# Patient Record
Sex: Female | Born: 1980
Health system: Southern US, Community
[De-identification: ages and names within clinical notes are randomized; demographics above are authoritative.]

## PROBLEM LIST (undated history)

## (undated) ENCOUNTER — Inpatient Hospital Stay (HOSPITAL_COMMUNITY): Payer: Self-pay

## (undated) DIAGNOSIS — B019 Varicella without complication: Secondary | ICD-10-CM

## (undated) DIAGNOSIS — R519 Headache, unspecified: Secondary | ICD-10-CM

## (undated) DIAGNOSIS — F909 Attention-deficit hyperactivity disorder, unspecified type: Secondary | ICD-10-CM

## (undated) DIAGNOSIS — N390 Urinary tract infection, site not specified: Secondary | ICD-10-CM

## (undated) DIAGNOSIS — R51 Headache: Secondary | ICD-10-CM

## (undated) DIAGNOSIS — Z789 Other specified health status: Secondary | ICD-10-CM

## (undated) DIAGNOSIS — G43909 Migraine, unspecified, not intractable, without status migrainosus: Secondary | ICD-10-CM

## (undated) DIAGNOSIS — Z973 Presence of spectacles and contact lenses: Secondary | ICD-10-CM

## (undated) DIAGNOSIS — F419 Anxiety disorder, unspecified: Secondary | ICD-10-CM

## (undated) DIAGNOSIS — F329 Major depressive disorder, single episode, unspecified: Secondary | ICD-10-CM

## (undated) DIAGNOSIS — E282 Polycystic ovarian syndrome: Secondary | ICD-10-CM

## (undated) DIAGNOSIS — K219 Gastro-esophageal reflux disease without esophagitis: Secondary | ICD-10-CM

## (undated) DIAGNOSIS — L03119 Cellulitis of unspecified part of limb: Secondary | ICD-10-CM

## (undated) DIAGNOSIS — R600 Localized edema: Secondary | ICD-10-CM

## (undated) DIAGNOSIS — F32A Depression, unspecified: Secondary | ICD-10-CM

## (undated) DIAGNOSIS — E559 Vitamin D deficiency, unspecified: Secondary | ICD-10-CM

## (undated) HISTORY — PX: WISDOM TOOTH EXTRACTION: SHX21

## (undated) HISTORY — DX: Varicella without complication: B01.9

## (undated) HISTORY — DX: Major depressive disorder, single episode, unspecified: F32.9

## (undated) HISTORY — DX: Migraine, unspecified, not intractable, without status migrainosus: G43.909

## (undated) HISTORY — DX: Headache, unspecified: R51.9

## (undated) HISTORY — PX: ESOPHAGOGASTRODUODENOSCOPY: SHX1529

## (undated) HISTORY — DX: Depression, unspecified: F32.A

## (undated) HISTORY — DX: Headache: R51

## (undated) HISTORY — DX: Urinary tract infection, site not specified: N39.0

---

## 1998-12-22 HISTORY — PX: LEEP: SHX91

## 1998-12-28 ENCOUNTER — Emergency Department (HOSPITAL_COMMUNITY): Admission: EM | Admit: 1998-12-28 | Discharge: 1998-12-28 | Payer: Self-pay | Admitting: *Deleted

## 1999-03-09 ENCOUNTER — Inpatient Hospital Stay (HOSPITAL_COMMUNITY): Admission: AD | Admit: 1999-03-09 | Discharge: 1999-03-09 | Payer: Self-pay | Admitting: Obstetrics & Gynecology

## 1999-04-14 ENCOUNTER — Inpatient Hospital Stay (HOSPITAL_COMMUNITY): Admission: AD | Admit: 1999-04-14 | Discharge: 1999-04-14 | Payer: Self-pay | Admitting: Obstetrics & Gynecology

## 1999-05-04 ENCOUNTER — Inpatient Hospital Stay (HOSPITAL_COMMUNITY): Admission: AD | Admit: 1999-05-04 | Discharge: 1999-05-04 | Payer: Self-pay | Admitting: Obstetrics and Gynecology

## 1999-05-13 ENCOUNTER — Inpatient Hospital Stay (HOSPITAL_COMMUNITY): Admission: AD | Admit: 1999-05-13 | Discharge: 1999-05-13 | Payer: Self-pay | Admitting: Obstetrics and Gynecology

## 1999-05-29 ENCOUNTER — Inpatient Hospital Stay (HOSPITAL_COMMUNITY): Admission: AD | Admit: 1999-05-29 | Discharge: 1999-05-31 | Payer: Self-pay | Admitting: Obstetrics and Gynecology

## 1999-08-12 ENCOUNTER — Other Ambulatory Visit: Admission: RE | Admit: 1999-08-12 | Discharge: 1999-08-12 | Payer: Self-pay | Admitting: Obstetrics & Gynecology

## 2000-08-11 ENCOUNTER — Other Ambulatory Visit: Admission: RE | Admit: 2000-08-11 | Discharge: 2000-08-11 | Payer: Self-pay | Admitting: Obstetrics and Gynecology

## 2001-02-01 ENCOUNTER — Encounter: Payer: Self-pay | Admitting: Emergency Medicine

## 2001-02-01 ENCOUNTER — Emergency Department (HOSPITAL_COMMUNITY): Admission: EM | Admit: 2001-02-01 | Discharge: 2001-02-02 | Payer: Self-pay | Admitting: Emergency Medicine

## 2001-03-29 ENCOUNTER — Emergency Department (HOSPITAL_COMMUNITY): Admission: EM | Admit: 2001-03-29 | Discharge: 2001-03-29 | Payer: Self-pay | Admitting: *Deleted

## 2001-06-15 ENCOUNTER — Emergency Department (HOSPITAL_COMMUNITY): Admission: EM | Admit: 2001-06-15 | Discharge: 2001-06-15 | Payer: Self-pay | Admitting: Emergency Medicine

## 2001-09-21 ENCOUNTER — Encounter: Payer: Self-pay | Admitting: Family Medicine

## 2001-09-21 ENCOUNTER — Encounter: Admission: RE | Admit: 2001-09-21 | Discharge: 2001-09-21 | Payer: Self-pay | Admitting: Family Medicine

## 2001-11-22 ENCOUNTER — Emergency Department (HOSPITAL_COMMUNITY): Admission: EM | Admit: 2001-11-22 | Discharge: 2001-11-22 | Payer: Self-pay | Admitting: Emergency Medicine

## 2001-11-22 ENCOUNTER — Encounter: Payer: Self-pay | Admitting: Emergency Medicine

## 2001-11-25 ENCOUNTER — Emergency Department (HOSPITAL_COMMUNITY): Admission: EM | Admit: 2001-11-25 | Discharge: 2001-11-25 | Payer: Self-pay | Admitting: Emergency Medicine

## 2001-12-22 HISTORY — PX: OTHER SURGICAL HISTORY: SHX169

## 2002-05-25 ENCOUNTER — Inpatient Hospital Stay (HOSPITAL_COMMUNITY): Admission: AD | Admit: 2002-05-25 | Discharge: 2002-05-25 | Payer: Self-pay | Admitting: Obstetrics and Gynecology

## 2002-05-27 ENCOUNTER — Encounter: Payer: Self-pay | Admitting: *Deleted

## 2002-05-27 ENCOUNTER — Inpatient Hospital Stay (HOSPITAL_COMMUNITY): Admission: AD | Admit: 2002-05-27 | Discharge: 2002-05-27 | Payer: Self-pay | Admitting: *Deleted

## 2002-12-22 HISTORY — PX: LEEP: SHX91

## 2003-04-06 ENCOUNTER — Inpatient Hospital Stay (HOSPITAL_COMMUNITY): Admission: AD | Admit: 2003-04-06 | Discharge: 2003-04-06 | Payer: Self-pay | Admitting: Family Medicine

## 2003-07-20 ENCOUNTER — Encounter: Payer: Self-pay | Admitting: Obstetrics & Gynecology

## 2003-07-20 ENCOUNTER — Ambulatory Visit (HOSPITAL_COMMUNITY): Admission: RE | Admit: 2003-07-20 | Discharge: 2003-07-20 | Payer: Self-pay | Admitting: Obstetrics & Gynecology

## 2003-08-19 ENCOUNTER — Inpatient Hospital Stay (HOSPITAL_COMMUNITY): Admission: AD | Admit: 2003-08-19 | Discharge: 2003-08-19 | Payer: Self-pay | Admitting: Obstetrics

## 2003-09-28 ENCOUNTER — Encounter: Payer: Self-pay | Admitting: Obstetrics & Gynecology

## 2003-09-28 ENCOUNTER — Ambulatory Visit (HOSPITAL_COMMUNITY): Admission: RE | Admit: 2003-09-28 | Discharge: 2003-09-28 | Payer: Self-pay | Admitting: Obstetrics & Gynecology

## 2004-01-11 ENCOUNTER — Inpatient Hospital Stay (HOSPITAL_COMMUNITY): Admission: AD | Admit: 2004-01-11 | Discharge: 2004-01-11 | Payer: Self-pay | Admitting: Obstetrics & Gynecology

## 2004-01-23 ENCOUNTER — Inpatient Hospital Stay (HOSPITAL_COMMUNITY): Admission: AD | Admit: 2004-01-23 | Discharge: 2004-01-24 | Payer: Self-pay | Admitting: Obstetrics & Gynecology

## 2004-02-26 ENCOUNTER — Inpatient Hospital Stay (HOSPITAL_COMMUNITY): Admission: AD | Admit: 2004-02-26 | Discharge: 2004-02-28 | Payer: Self-pay | Admitting: Obstetrics & Gynecology

## 2004-03-12 ENCOUNTER — Emergency Department (HOSPITAL_COMMUNITY): Admission: EM | Admit: 2004-03-12 | Discharge: 2004-03-12 | Payer: Self-pay | Admitting: Emergency Medicine

## 2004-05-28 ENCOUNTER — Emergency Department (HOSPITAL_COMMUNITY): Admission: EM | Admit: 2004-05-28 | Discharge: 2004-05-29 | Payer: Self-pay | Admitting: Emergency Medicine

## 2004-08-17 ENCOUNTER — Emergency Department (HOSPITAL_COMMUNITY): Admission: EM | Admit: 2004-08-17 | Discharge: 2004-08-17 | Payer: Self-pay | Admitting: Emergency Medicine

## 2004-08-18 ENCOUNTER — Ambulatory Visit (HOSPITAL_COMMUNITY): Admission: RE | Admit: 2004-08-18 | Discharge: 2004-08-18 | Payer: Self-pay | Admitting: Internal Medicine

## 2004-11-24 ENCOUNTER — Emergency Department (HOSPITAL_COMMUNITY): Admission: EM | Admit: 2004-11-24 | Discharge: 2004-11-24 | Payer: Self-pay | Admitting: Emergency Medicine

## 2004-11-29 ENCOUNTER — Emergency Department (HOSPITAL_COMMUNITY): Admission: EM | Admit: 2004-11-29 | Discharge: 2004-11-29 | Payer: Self-pay | Admitting: Emergency Medicine

## 2004-12-01 ENCOUNTER — Emergency Department (HOSPITAL_COMMUNITY): Admission: EM | Admit: 2004-12-01 | Discharge: 2004-12-01 | Payer: Self-pay | Admitting: Emergency Medicine

## 2004-12-22 HISTORY — PX: HAND SURGERY: SHX662

## 2006-05-18 ENCOUNTER — Emergency Department (HOSPITAL_COMMUNITY): Admission: EM | Admit: 2006-05-18 | Discharge: 2006-05-18 | Payer: Self-pay | Admitting: Emergency Medicine

## 2006-05-21 ENCOUNTER — Emergency Department (HOSPITAL_COMMUNITY): Admission: EM | Admit: 2006-05-21 | Discharge: 2006-05-21 | Payer: Self-pay | Admitting: Emergency Medicine

## 2007-12-20 ENCOUNTER — Inpatient Hospital Stay (HOSPITAL_COMMUNITY): Admission: AD | Admit: 2007-12-20 | Discharge: 2007-12-20 | Payer: Self-pay | Admitting: Obstetrics and Gynecology

## 2007-12-23 ENCOUNTER — Encounter: Payer: Self-pay | Admitting: Family Medicine

## 2008-03-29 ENCOUNTER — Ambulatory Visit: Payer: Self-pay | Admitting: Family Medicine

## 2008-03-29 ENCOUNTER — Ambulatory Visit: Payer: Self-pay | Admitting: *Deleted

## 2008-03-29 ENCOUNTER — Encounter (INDEPENDENT_AMBULATORY_CARE_PROVIDER_SITE_OTHER): Payer: Self-pay | Admitting: Nurse Practitioner

## 2008-03-29 LAB — CONVERTED CEMR LAB
ALT: 17 units/L (ref 0–35)
AST: 16 units/L (ref 0–37)
Albumin: 4.4 g/dL (ref 3.5–5.2)
Alkaline Phosphatase: 66 units/L (ref 39–117)
BUN: 9 mg/dL (ref 6–23)
Basophils Absolute: 0 10*3/uL (ref 0.0–0.1)
Basophils Relative: 0 % (ref 0–1)
CO2: 24 meq/L (ref 19–32)
Calcium: 9.7 mg/dL (ref 8.4–10.5)
Chloride: 105 meq/L (ref 96–112)
Creatinine, Ser: 0.75 mg/dL (ref 0.40–1.20)
Eosinophils Absolute: 0.2 10*3/uL (ref 0.0–0.7)
Eosinophils Relative: 2 % (ref 0–5)
Glucose, Bld: 90 mg/dL (ref 70–99)
HCT: 41.2 % (ref 36.0–46.0)
Hemoglobin: 13.6 g/dL (ref 12.0–15.0)
Lymphocytes Relative: 40 % (ref 12–46)
Lymphs Abs: 4.2 10*3/uL — ABNORMAL HIGH (ref 0.7–4.0)
MCHC: 33 g/dL (ref 30.0–36.0)
MCV: 91.2 fL (ref 78.0–100.0)
Monocytes Absolute: 0.7 10*3/uL (ref 0.1–1.0)
Monocytes Relative: 7 % (ref 3–12)
Neutro Abs: 5.4 10*3/uL (ref 1.7–7.7)
Neutrophils Relative %: 51 % (ref 43–77)
Platelets: 286 10*3/uL (ref 150–400)
Potassium: 4.9 meq/L (ref 3.5–5.3)
RBC: 4.52 M/uL (ref 3.87–5.11)
RDW: 12.7 % (ref 11.5–15.5)
Sodium: 141 meq/L (ref 135–145)
TSH: 1.138 microintl units/mL (ref 0.350–5.50)
Total Bilirubin: 0.4 mg/dL (ref 0.3–1.2)
Total Protein: 7.3 g/dL (ref 6.0–8.3)
WBC: 10.6 10*3/uL — ABNORMAL HIGH (ref 4.0–10.5)

## 2008-04-07 ENCOUNTER — Ambulatory Visit: Payer: Self-pay | Admitting: Family Medicine

## 2008-04-14 ENCOUNTER — Emergency Department (HOSPITAL_COMMUNITY): Admission: EM | Admit: 2008-04-14 | Discharge: 2008-04-14 | Payer: Self-pay | Admitting: Emergency Medicine

## 2008-04-16 ENCOUNTER — Inpatient Hospital Stay (HOSPITAL_COMMUNITY): Admission: EM | Admit: 2008-04-16 | Discharge: 2008-04-20 | Payer: Self-pay | Admitting: Emergency Medicine

## 2008-04-25 ENCOUNTER — Ambulatory Visit: Payer: Self-pay | Admitting: Family Medicine

## 2008-05-02 DIAGNOSIS — E669 Obesity, unspecified: Secondary | ICD-10-CM | POA: Insufficient documentation

## 2008-05-05 ENCOUNTER — Ambulatory Visit: Payer: Self-pay | Admitting: Family Medicine

## 2008-05-31 ENCOUNTER — Encounter (INDEPENDENT_AMBULATORY_CARE_PROVIDER_SITE_OTHER): Payer: Self-pay | Admitting: Nurse Practitioner

## 2008-05-31 ENCOUNTER — Ambulatory Visit: Payer: Self-pay | Admitting: Internal Medicine

## 2008-05-31 LAB — CONVERTED CEMR LAB
Cholesterol: 147 mg/dL (ref 0–200)
HDL: 59 mg/dL (ref >39)
LDL Cholesterol: 78 mg/dL (ref 0–99)
Total CHOL/HDL Ratio: 2.5
Triglycerides: 52 mg/dL (ref <150)
VLDL: 10 mg/dL (ref 0–40)

## 2008-09-08 ENCOUNTER — Ambulatory Visit: Payer: Self-pay | Admitting: Family Medicine

## 2008-09-08 DIAGNOSIS — N912 Amenorrhea, unspecified: Secondary | ICD-10-CM

## 2008-09-08 LAB — CONVERTED CEMR LAB
Beta hcg, urine, semiquantitative: NEGATIVE
Bilirubin Urine: NEGATIVE
Blood in Urine, dipstick: NEGATIVE
Glucose, Urine, Semiquant: NEGATIVE
Ketones, urine, test strip: NEGATIVE
Nitrite: NEGATIVE
Protein, U semiquant: NEGATIVE
Specific Gravity, Urine: 1.015
Urobilinogen, UA: 1
WBC Urine, dipstick: NEGATIVE
pH: 7.5

## 2008-09-09 ENCOUNTER — Encounter: Payer: Self-pay | Admitting: Family Medicine

## 2008-09-09 LAB — CONVERTED CEMR LAB
Chlamydia, DNA Probe: NEGATIVE
GC Probe Amp, Genital: NEGATIVE

## 2008-09-11 LAB — CONVERTED CEMR LAB
Candida species: NEGATIVE
Gardnerella vaginalis: POSITIVE — AB
Trichomonal Vaginitis: NEGATIVE

## 2008-10-18 ENCOUNTER — Telehealth: Payer: Self-pay | Admitting: Family Medicine

## 2008-11-28 ENCOUNTER — Ambulatory Visit: Payer: Self-pay | Admitting: Family Medicine

## 2008-11-29 ENCOUNTER — Encounter: Payer: Self-pay | Admitting: Family Medicine

## 2008-11-29 LAB — CONVERTED CEMR LAB
Candida species: NEGATIVE
Chlamydia, DNA Probe: NEGATIVE
GC Probe Amp, Genital: NEGATIVE
Gardnerella vaginalis: POSITIVE — AB
Trichomonal Vaginitis: NEGATIVE

## 2009-01-02 IMAGING — CR DG FOOT 2V*L*
2 series · 2 of 2 positions shown · non-contrast
Comparison: none

CLINICAL DATA: Swollen left foot.  No known injury.  None

LEFT FOOT - 2 VIEW

[view not recorded (1 of 2)]
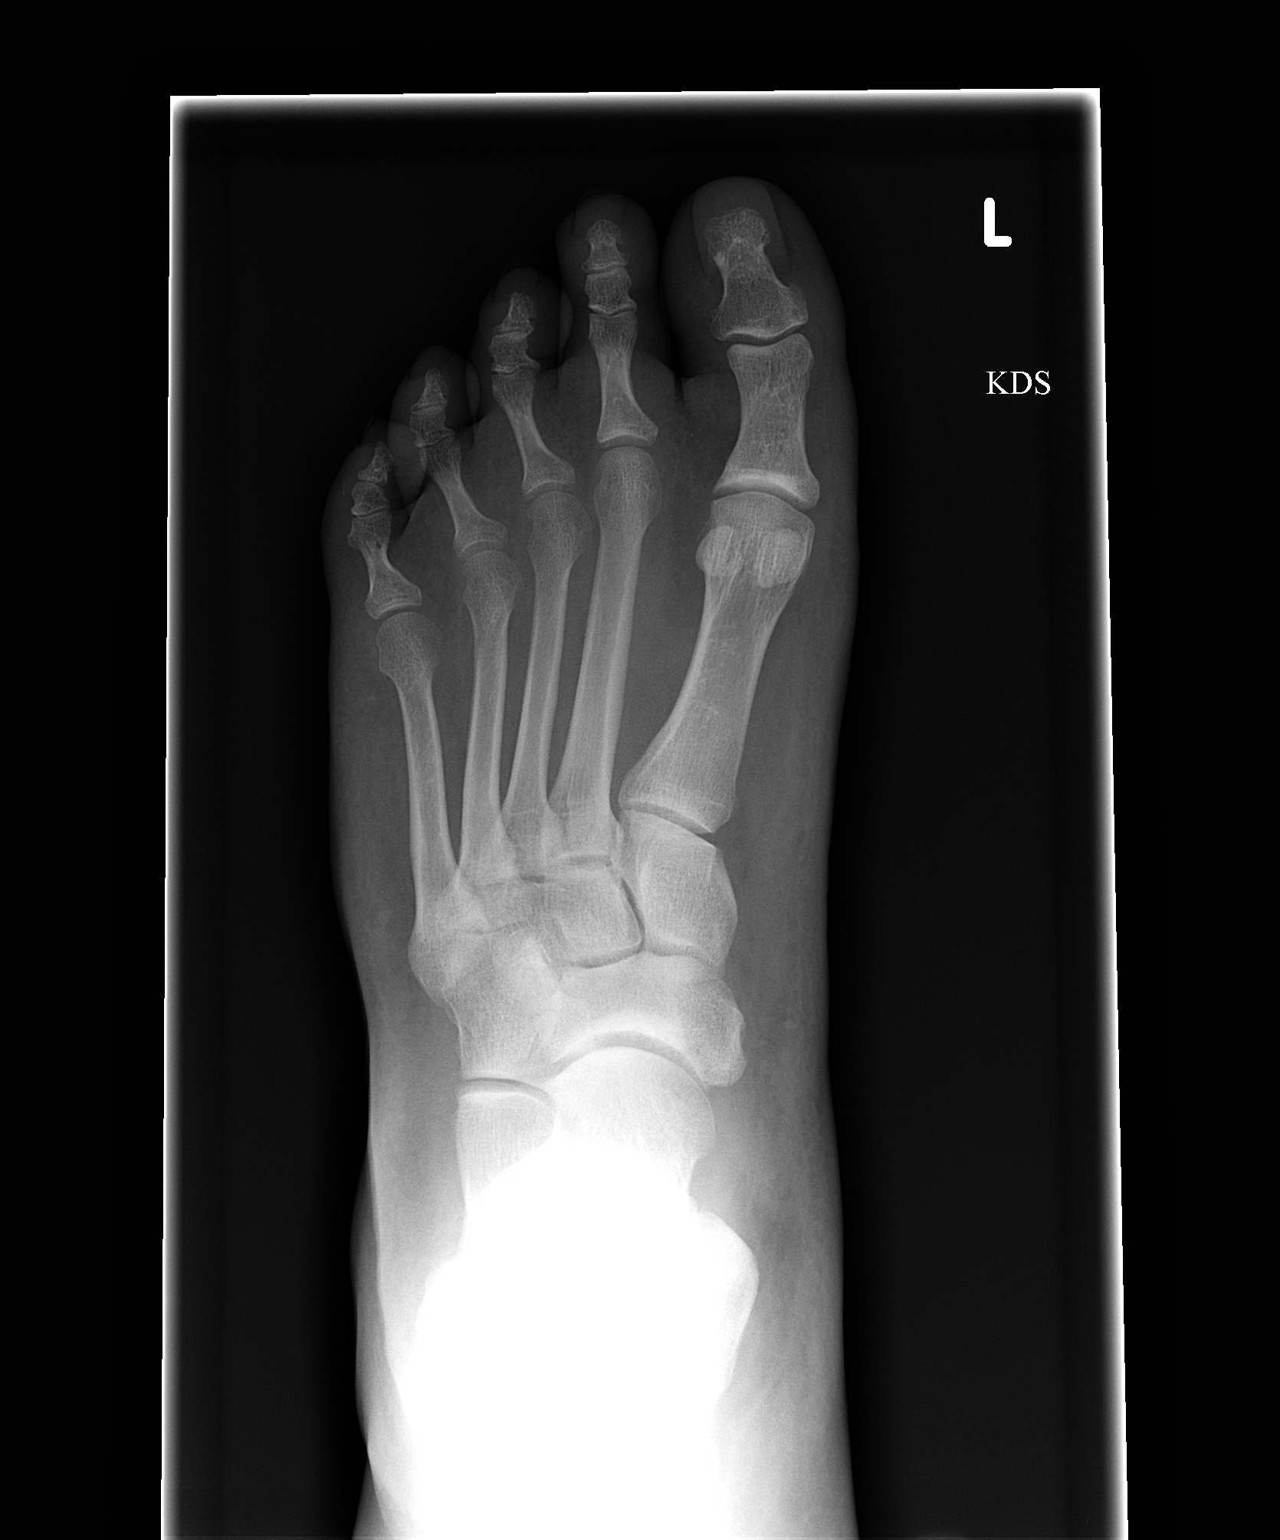

[view not recorded (2 of 2)]
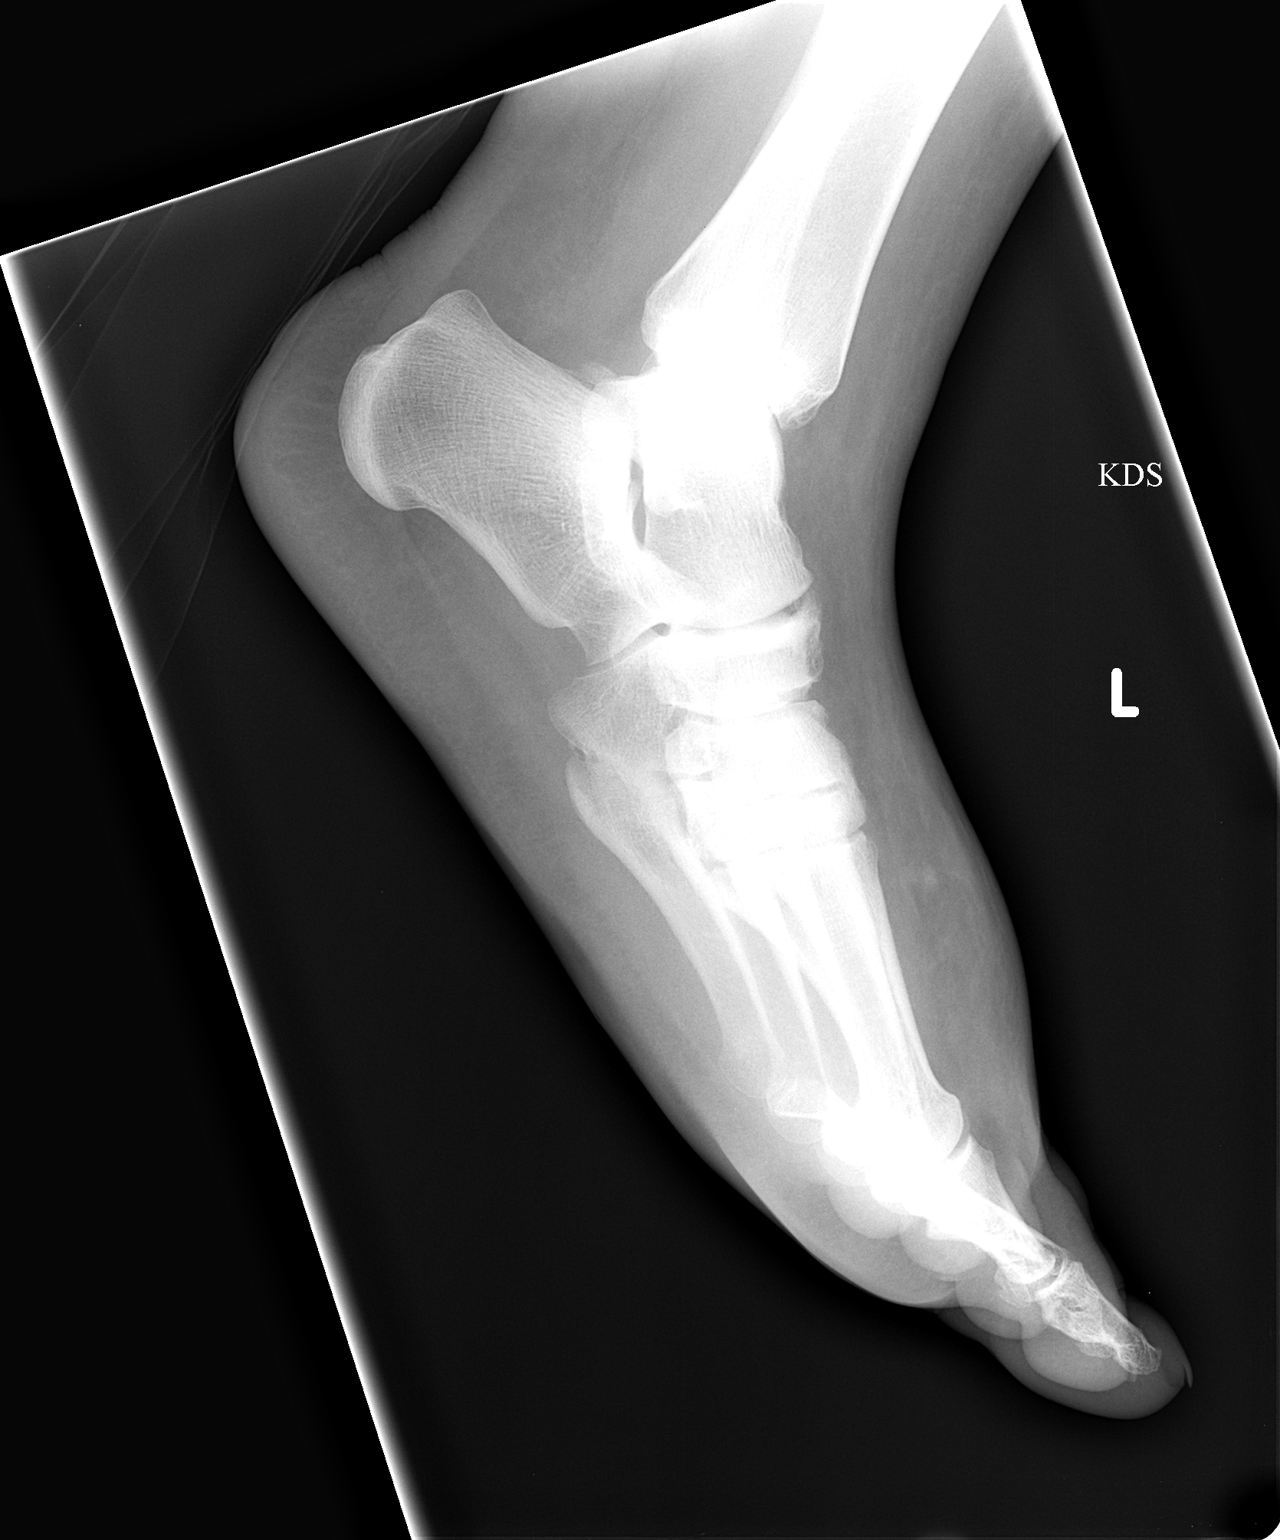

[2 of 2 positions shown; findings below may reference images not displayed]

FINDINGS: Marked soft tissue swelling of the forefoot, especially
the dorsal aspect.  No acute bony abnormality.  No radiopaque
foreign body.
IMPRESSION: Marked soft tissue swelling.  No acute osseous findings.

## 2009-01-04 IMAGING — CR DG FOOT COMPLETE 3+V*L*
3 series · 3 of 3 positions shown · non-contrast
Comparison: 04/14/2008

CLINICAL DATA: Splinter in bottom of foot 10 days ago now with
redness swelling and fever

LEFT FOOT - COMPLETE 3+ VIEW

[view not recorded (1 of 3)]
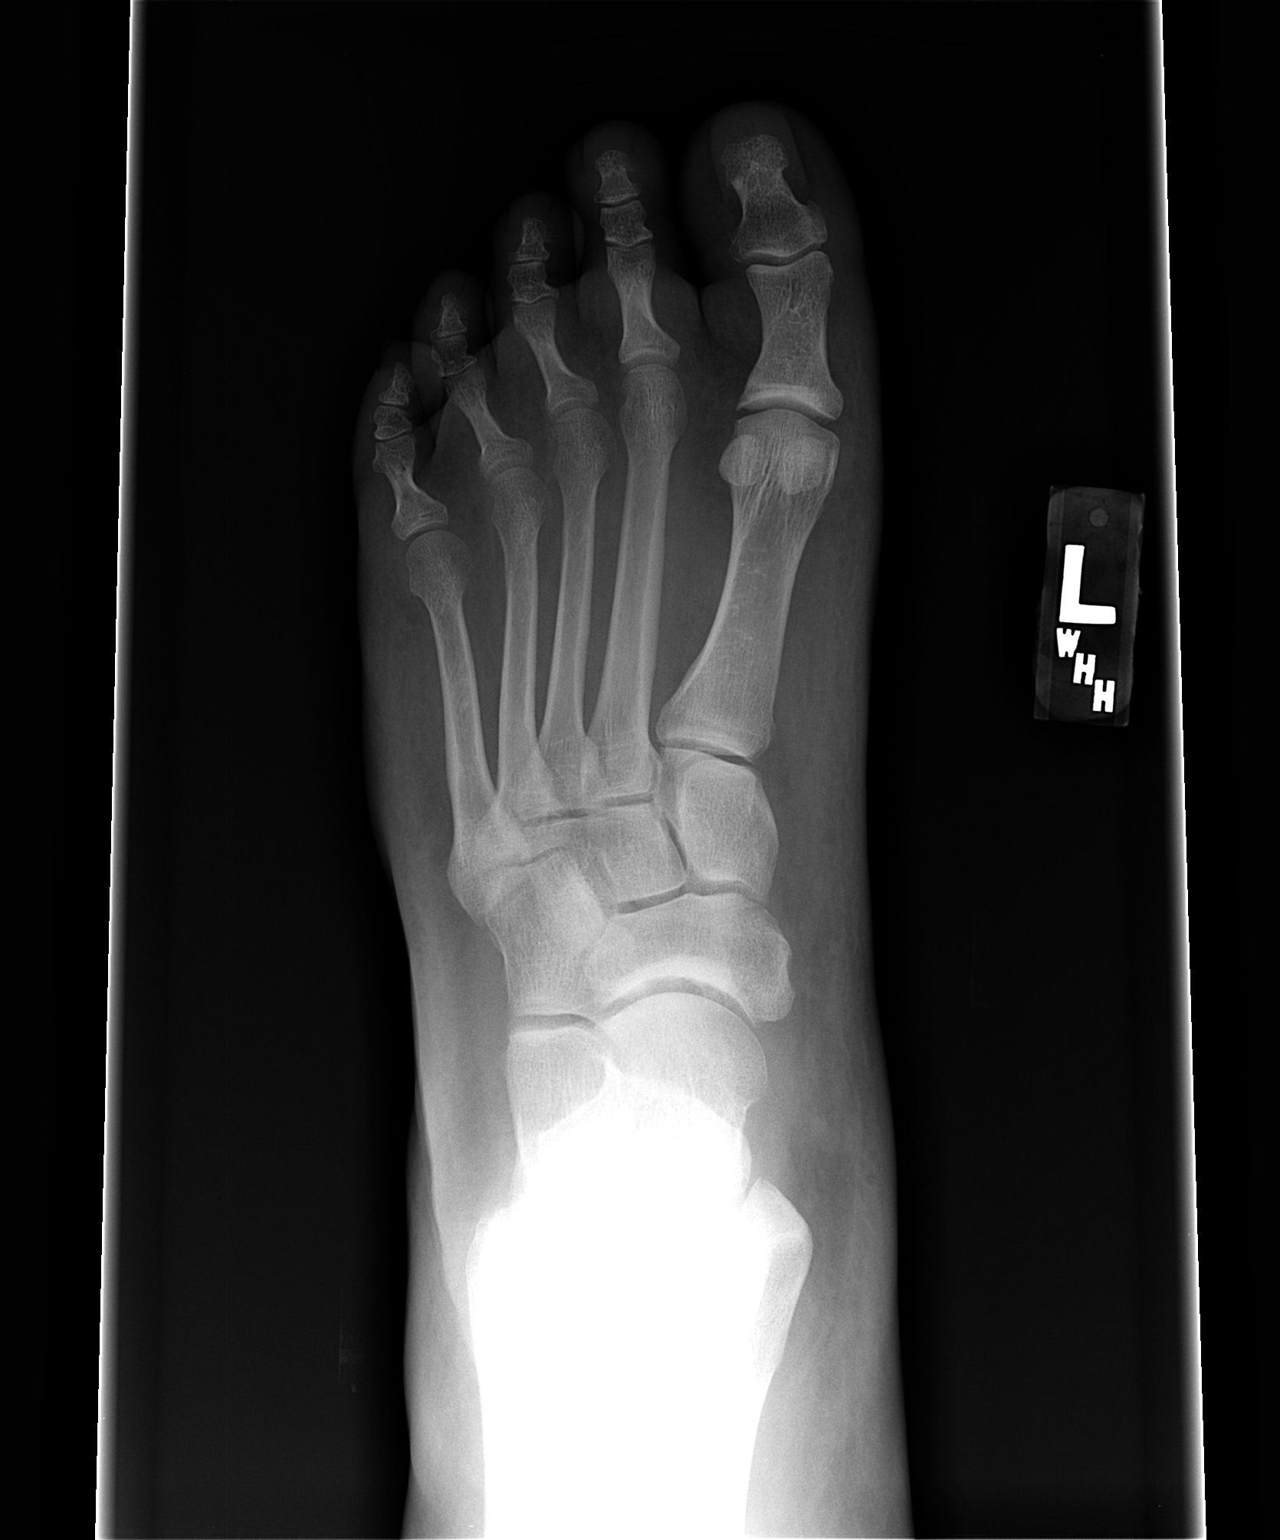

[view not recorded (2 of 3)]
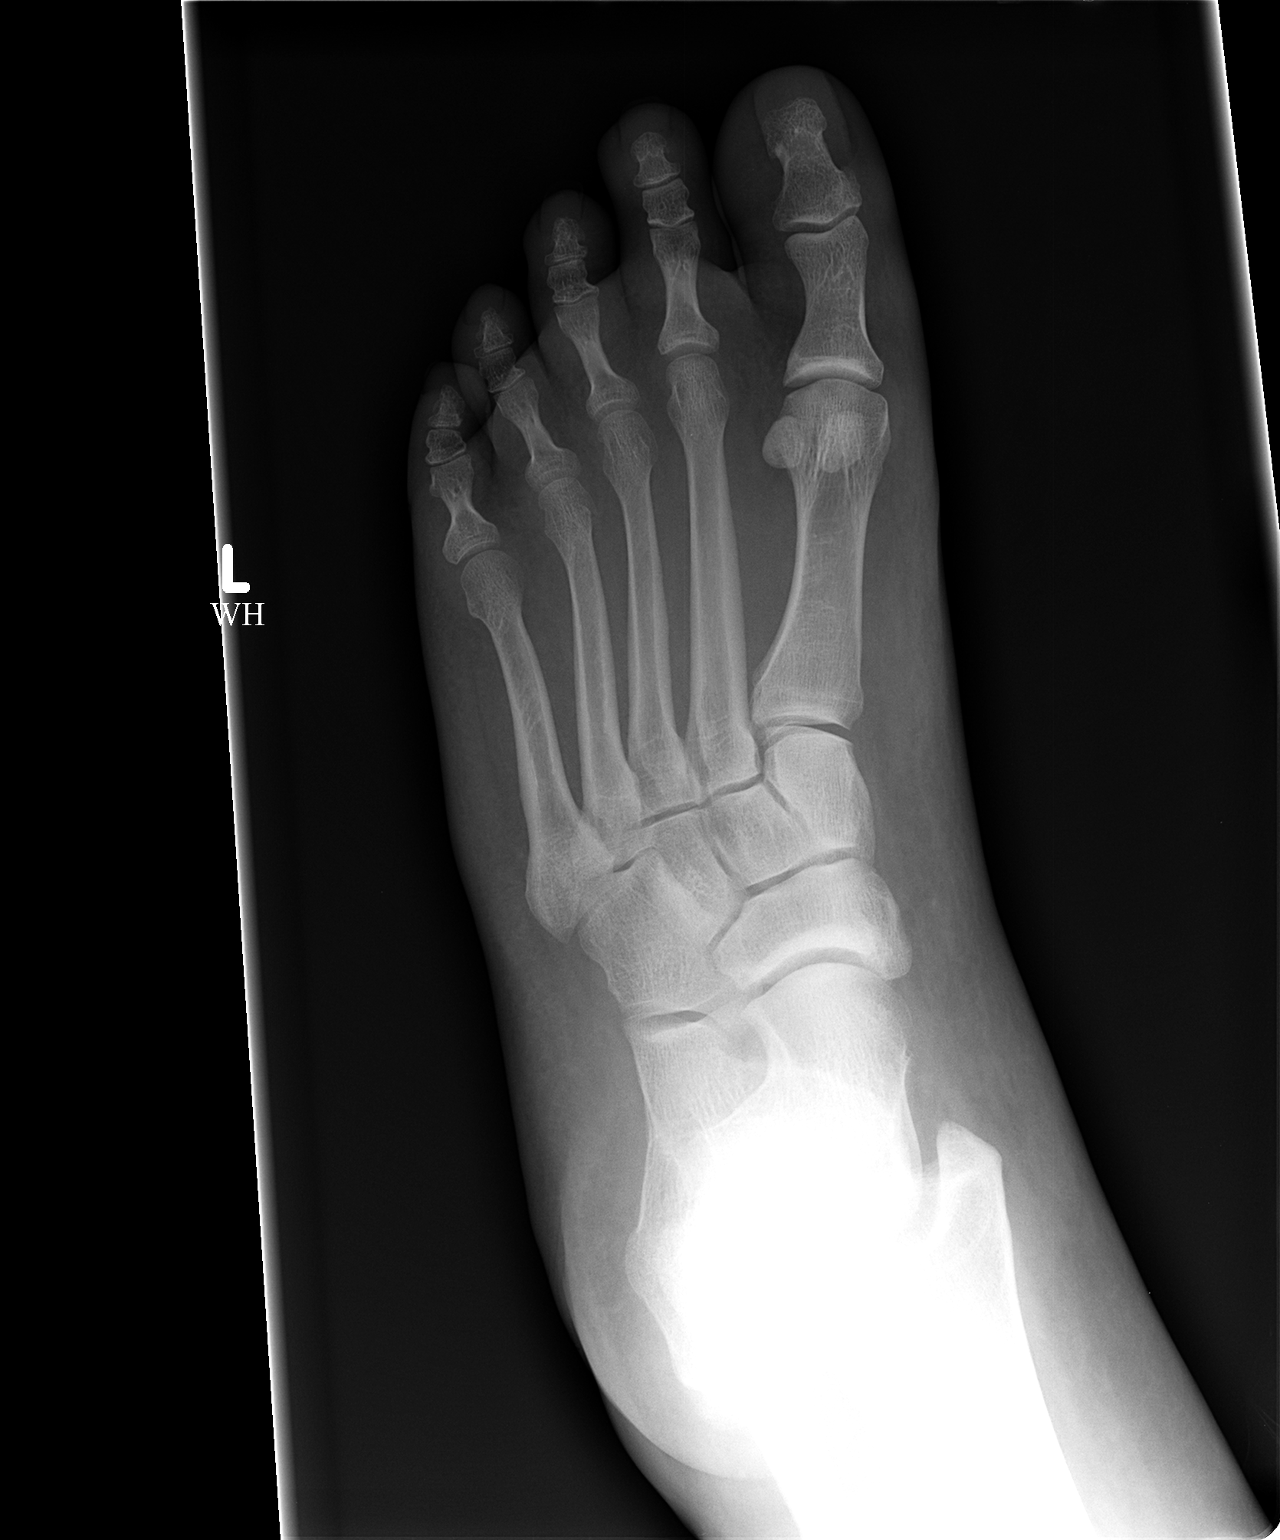

[view not recorded (3 of 3)]
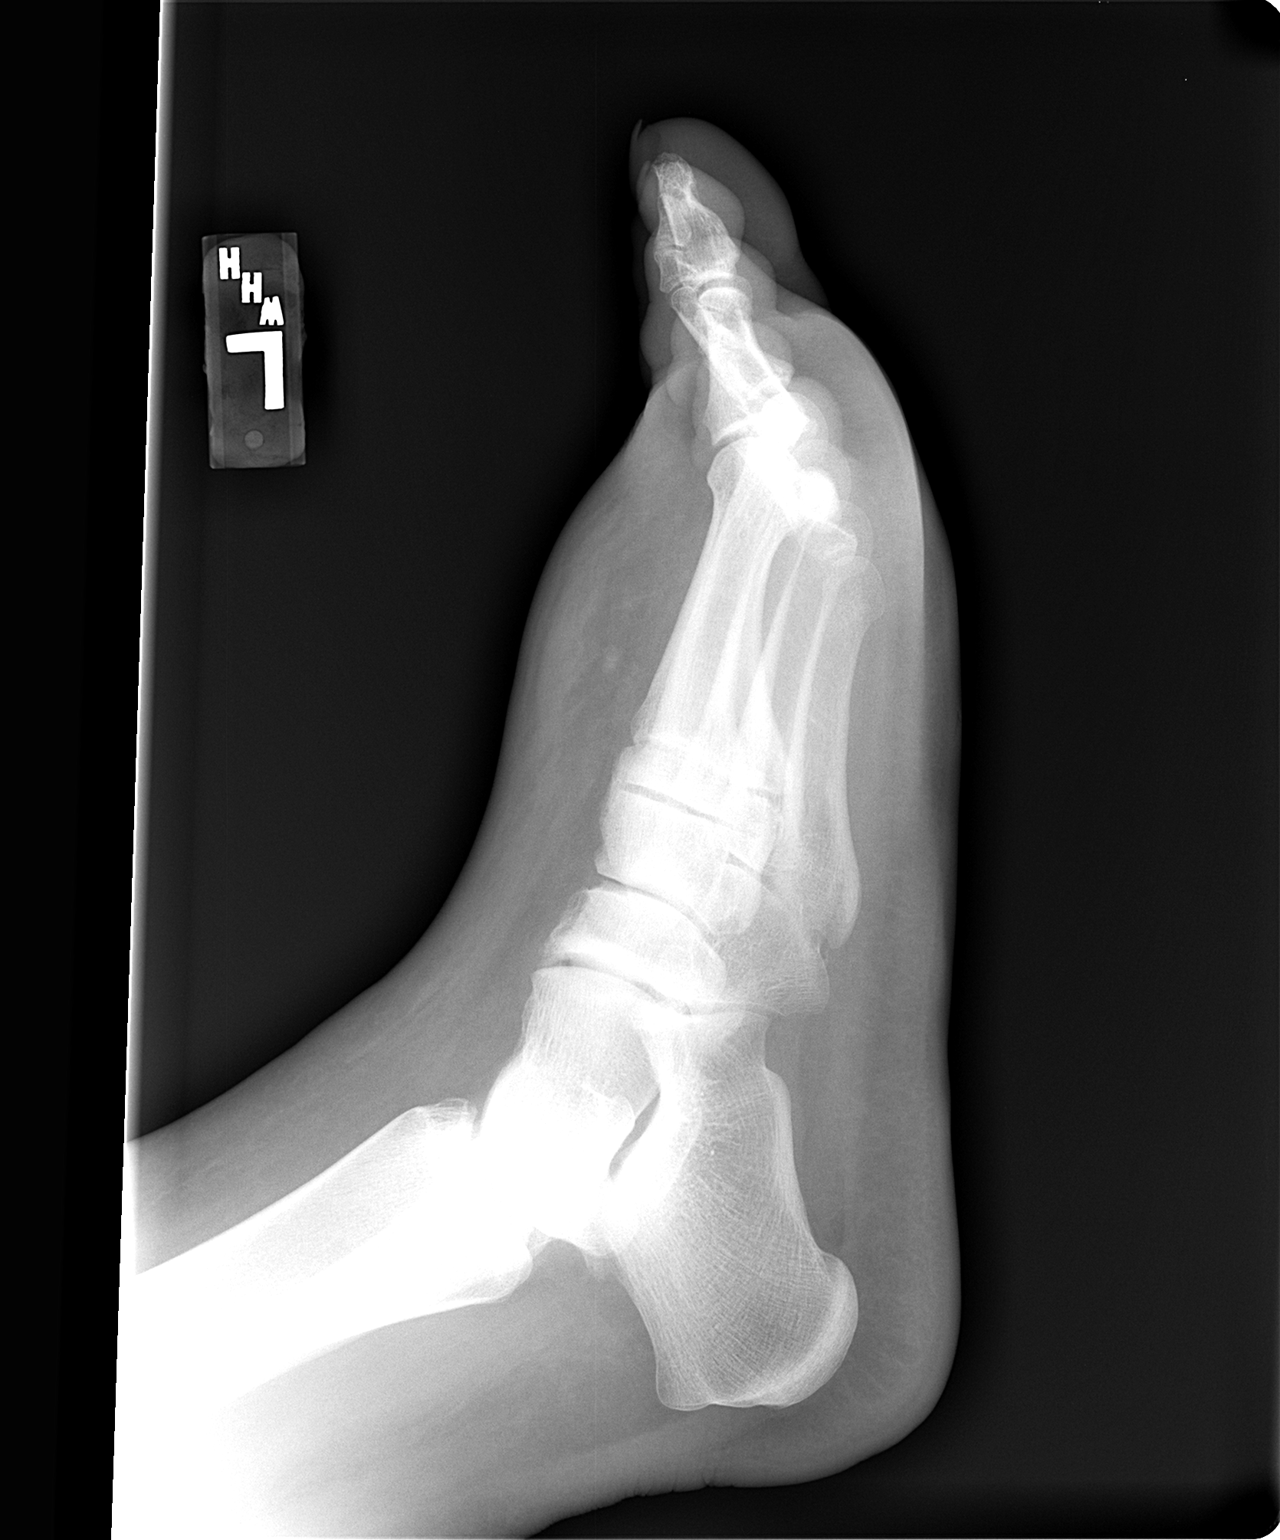

[3 of 3 positions shown; findings below may reference images not displayed]

FINDINGS: Worsening soft tissue swelling throughout the foot.  No
visible radiopaque foreign body.  No acute or focal osseous
findings.
IMPRESSION: Worsening soft tissue swelling without radiopaque foreign body or
visible osseous abnormality

## 2009-01-11 ENCOUNTER — Telehealth: Payer: Self-pay | Admitting: Family Medicine

## 2009-01-18 ENCOUNTER — Ambulatory Visit: Payer: Self-pay | Admitting: Family Medicine

## 2009-01-20 ENCOUNTER — Encounter: Payer: Self-pay | Admitting: Family Medicine

## 2009-01-22 LAB — CONVERTED CEMR LAB
AST: 17 units/L (ref 0–37)
Albumin: 3.8 g/dL (ref 3.5–5.2)
Alkaline Phosphatase: 61 units/L (ref 39–117)
Bilirubin, Direct: <0.1 mg/dL (ref 0.0–0.3)
CO2: 23 meq/L (ref 19–32)
Calcium: 8.7 mg/dL (ref 8.4–10.5)
Creatinine, Ser: 0.69 mg/dL (ref 0.40–1.20)
HDL: 57 mg/dL (ref >39)
Total Bilirubin: 0.2 mg/dL — ABNORMAL LOW (ref 0.3–1.2)

## 2009-03-01 ENCOUNTER — Ambulatory Visit: Payer: Self-pay | Admitting: Family Medicine

## 2009-03-09 DIAGNOSIS — L708 Other acne: Secondary | ICD-10-CM | POA: Insufficient documentation

## 2009-06-04 ENCOUNTER — Ambulatory Visit: Payer: Self-pay | Admitting: Family Medicine

## 2009-06-04 DIAGNOSIS — N643 Galactorrhea not associated with childbirth: Secondary | ICD-10-CM | POA: Insufficient documentation

## 2009-06-04 DIAGNOSIS — N644 Mastodynia: Secondary | ICD-10-CM | POA: Insufficient documentation

## 2009-06-07 ENCOUNTER — Ambulatory Visit (HOSPITAL_COMMUNITY): Admission: RE | Admit: 2009-06-07 | Discharge: 2009-06-07 | Payer: Self-pay | Admitting: Family Medicine

## 2009-09-24 ENCOUNTER — Encounter: Payer: Self-pay | Admitting: Family Medicine

## 2009-09-24 ENCOUNTER — Ambulatory Visit: Payer: Self-pay | Admitting: Internal Medicine

## 2009-09-26 ENCOUNTER — Ambulatory Visit: Payer: Self-pay | Admitting: Family Medicine

## 2009-09-26 DIAGNOSIS — R51 Headache: Secondary | ICD-10-CM

## 2009-10-31 ENCOUNTER — Telehealth (INDEPENDENT_AMBULATORY_CARE_PROVIDER_SITE_OTHER): Payer: Self-pay | Admitting: *Deleted

## 2009-11-30 ENCOUNTER — Telehealth: Payer: Self-pay | Admitting: Family Medicine

## 2010-04-11 ENCOUNTER — Telehealth: Payer: Self-pay | Admitting: Family Medicine

## 2010-04-19 ENCOUNTER — Encounter: Payer: Self-pay | Admitting: Family Medicine

## 2010-04-29 ENCOUNTER — Telehealth (INDEPENDENT_AMBULATORY_CARE_PROVIDER_SITE_OTHER): Payer: Self-pay | Admitting: *Deleted

## 2010-05-10 ENCOUNTER — Telehealth: Payer: Self-pay | Admitting: Family Medicine

## 2010-05-13 ENCOUNTER — Encounter: Payer: Self-pay | Admitting: Family Medicine

## 2010-12-28 ENCOUNTER — Inpatient Hospital Stay (HOSPITAL_COMMUNITY)
Admission: AD | Admit: 2010-12-28 | Discharge: 2010-12-28 | Payer: Self-pay | Source: Home / Self Care | Attending: Obstetrics and Gynecology | Admitting: Obstetrics and Gynecology

## 2011-01-06 LAB — URINALYSIS, ROUTINE W REFLEX MICROSCOPIC
Bilirubin Urine: NEGATIVE
Hgb urine dipstick: NEGATIVE
Ketones, ur: 15 mg/dL — AB
Nitrite: NEGATIVE
Protein, ur: NEGATIVE mg/dL
Specific Gravity, Urine: >1.03 — ABNORMAL HIGH (ref 1.005–1.030)
Urine Glucose, Fasting: NEGATIVE mg/dL
Urobilinogen, UA: 1 mg/dL (ref 0.0–1.0)
pH: 6 (ref 5.0–8.0)

## 2011-01-06 LAB — WET PREP, GENITAL
Clue Cells Wet Prep HPF POC: NONE SEEN
Trich, Wet Prep: NONE SEEN
Yeast Wet Prep HPF POC: NONE SEEN

## 2011-01-06 LAB — POCT PREGNANCY, URINE: Preg Test, Ur: NEGATIVE

## 2011-01-12 ENCOUNTER — Encounter: Payer: Self-pay | Admitting: Obstetrics & Gynecology

## 2011-01-23 NOTE — Progress Notes (Signed)
Summary: paper  Phone Note Call from Patient   Summary of Call: do you have her paper for housing authority          was in an enevlope  and she added a note call back at [phone] Initial call taken by: Lind Guest,  May 10, 2010 3:08 PM  Follow-up for Phone Call        pls let pt know that i received it, but I am ubnable to sign her disabled on medical grounds,for houising. Migraimnes legally are not a reason to label someone disabled. I suggesrt she ask hger social worker to assist in getting safer housng, I am aware of what she is trying to do and understand, but a dx ofmigraine headaches does not qualify for her being disabled.  Also she recently had her records moved to [place] where she lives,. so i suggest since she is no longer wiorth this pracrice she get her current doc to help in any way possible Follow-up by: Syliva Overman MD,  May 12, 2010 9:51 PM  Additional Follow-up for Phone Call Additional follow up Details #1::        pls disregard thios aletter willo be provided, pls notify her when it is ready Additional Follow-up by: Syliva Overman MD,  May 13, 2010 8:30 AM    Additional Follow-up for Phone Call Additional follow up Details #2::    PATIENT PICKED UP TODAY Follow-up by: Lind Guest,  May 14, 2010 4:12 PM

## 2011-01-23 NOTE — Letter (Signed)
Summary: CONS  CONS   Imported By: Lind Guest 05/27/2010 15:08:40  _____________________________________________________________________  External Attachment:    Type:   Image     Comment:   External Document

## 2011-01-23 NOTE — Letter (Signed)
Summary: DEMO  DEMO   Imported By: Lind Guest 05/27/2010 15:09:03  _____________________________________________________________________  External Attachment:    Type:   Image     Comment:   External Document

## 2011-01-23 NOTE — Letter (Signed)
Summary: FORM DEPARTMENT OF HOUSING  FORM DEPARTMENT OF HOUSING   Imported By: Lind Guest 05/13/2010 15:37:56  _____________________________________________________________________  External Attachment:    Type:   Image     Comment:   External Document

## 2011-01-23 NOTE — Letter (Signed)
Summary: medical release  medical release   Imported By: Lind Guest 04/19/2010 14:18:17  _____________________________________________________________________  External Attachment:    Type:   Image     Comment:   External Document

## 2011-01-23 NOTE — Progress Notes (Signed)
Summary: transferred records  Phone Note Call from Patient   Summary of Call: pt tranferred records to other family pratice. She is no longer pt here.  Initial call taken by: Rudene Anda,  April 11, 2010 3:54 PM

## 2011-01-23 NOTE — Letter (Signed)
Summary: LABS  LABS   Imported By: Lind Guest 05/27/2010 15:10:00  _____________________________________________________________________  External Attachment:    Type:   Image     Comment:   External Document

## 2011-01-23 NOTE — Letter (Signed)
Summary: MISC  MISC   Imported By: Lind Guest 05/27/2010 15:10:24  _____________________________________________________________________  External Attachment:    Type:   Image     Comment:   External Document

## 2011-01-23 NOTE — Letter (Signed)
Summary: OFFICE NOTES  OFFICE NOTES   Imported By: Lind Guest 05/27/2010 15:11:01  _____________________________________________________________________  External Attachment:    Type:   Image     Comment:   External Document

## 2011-01-23 NOTE — Letter (Signed)
Summary: OFFICE NOTES  OFFICE NOTES   Imported By: Lind Guest 05/27/2010 15:09:28  _____________________________________________________________________  External Attachment:    Type:   Image     Comment:   External Document

## 2011-01-23 NOTE — Progress Notes (Signed)
Summary: FORM  Phone Note Call from Patient   Summary of Call: WAS WONDERING IF HER PAPER IS DONE IT WAS FAXED WEEK BEFORE LAST NAD IT IS ABOUT MEDICAL FORM ON HER MIGRAINES CALL BACK AT [phone] Initial call taken by: Lind Guest,  Apr 29, 2010 10:18 AM  Follow-up for Phone Call        states form from housing authority, advised nothing in chart Follow-up by: Adella Hare LPN,  Apr 29, 1609 10:39 AM

## 2011-01-24 NOTE — Letter (Signed)
Summary: REFERRAL LOG SHEET  REFERRAL LOG SHEET   Imported By: Cindy [name] 05/27/2010 15:11:28  _____________________________________________________________________  External Attachment:    Type:   Image     Comment:   External Document

## 2011-05-06 NOTE — H&P (Signed)
Cindy, [name]            ACCOUNT NO.:  [id number]   MEDICAL RECORD NO.:  [id number]          PATIENT TYPE:  EMS   LOCATION:  ED                            FACILITY:  [facility]   PHYSICIAN:  Osvaldo Shipper, MD     DATE OF BIRTH:  08/19/1981   DATE OF ADMISSION:  04/16/2008  DATE OF DISCHARGE:  LH                              HISTORY & PHYSICAL   The patient goes to a clinic in [place] for her health needs.   ADMISSION DIAGNOSES:  1. Left lower extremity cellulitis.  2. History of fluid retention.   CHIEF COMPLAINT:  Left leg pain for the past 2 days.   HISTORY OF PRESENT ILLNESS:  The patient is a 30 year old African  American female who is obese and has a history of fluid retention who  tells me that she stepped on a splinter about 2 weeks ago which she  promptly removed from her left foot. She did not have any problems after  that. Then about 2 days ago she experienced pain in the left foot  region, and she noted it was red and getting swollen. She initially went  to urgent care and got a dose of antibiotic as an injection and then was  sent home. Then subsequently she came into the ED on the 24th of April.  At that time it appears she was given vancomycin and was again sent home  on doxycycline.   However, in the past couple of days, the patient's pain and swelling  have not improved. She has been unable to walk on her left leg. The pain  and swelling have significantly worsened. The pain is about 8/10 in  intensity, sharp pain, located mostly in the left lower extremity area.  All of these symptoms started initially in the ankle region. She also  experienced fever, highest was 104.5.  Chills and some nausea but no  emesis. Also had some headaches. Denies similar complaints in the past.   HOME MEDICATIONS:  1. Hydrochlorothiazide 25 mg daily.  2. Doxycycline that was prescribed on April 24th.   ALLERGIES:  No known drug allergies.   PAST MEDICAL HISTORY:  1.  Fluid retention.  2. Abnormal Pap smears in the past.   SURGICAL HISTORY:  Surgery on the left hand where she had some pins  placed.   SOCIAL HISTORY:  She lives in [place]. 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 Denies smoking cigarettes; however, she admits to  smoking marijuana occasionally. Social alcohol consumption. She is  adopted so she does not know her family history.   REVIEW OF SYSTEMS:  GENERAL:  Positive for weakness, malaise. HEENT:  Unremarkable. CARDIOVASCULAR:  Unremarkable.  RESPIRATORY:  Unremarkable.  GI:  Unremarkable. GU:  Unremarkable. MUSCULOSKELETAL:  As in HPI. NEUROLOGIC:  Unremarkable. PSYCHIATRIC:  Unremarkable. Other  systems are unremarkable.   PHYSICAL EXAMINATION:  VITAL SIGNS:  Temperature 100.7, blood pressure  122/63. Heart rate initially was 116 and then about 98. Respiratory rate  is about 18. Saturation 98% on room air.  GENERAL:  Obese African American female who is in  no distress.  HEENT:  There is no pallor. No icterus. Oral mucous and pharynx are  slightly dry. No lesions are noted.  NECK:  Supple. No thyromegaly is appreciated.  LUNGS:  Clear to auscultation bilaterally. No rales or wheezes are  appreciated.  CARDIOVASCULAR:  S1, S2 normal and regular. No murmurs are appreciated.  ABDOMEN:  Soft, nontender, nondistended.  EXTREMITIES:  The left lower leg shows extensive erythema, swelling,  warmth over the lower half of the lower extremity and extending all the  way to the foot area. Peripheral pulses are faint but palpable. No  obvious wound is noted on the foot. No crepitation is appreciated. No  fluctuation is present. The ankle joint has limited range of motion due  to extreme tenderness. She also has evidence for left inguinal  lymphadenopathy and some erythema in the lymph nodes in that area as  well.  NEUROLOGIC:  The patient is alert and oriented x3. No focal neurologic  deficits are present.   LABORATORY  DATA:  White count today is 13.8. It was 16.2 on the 24th.  Hemoglobin is normal. Platelet count is normal. Sodium is 134, and  potassium is 3.2. Glucose is 107. BUN and creatinine are normal. She had  imaging studies done including x-ray of the tibia/fibula which showed  marked soft tissue swelling with no evidence for air or any bony  abnormalities. X-ray of the foot also showed worsening soft tissue  swelling without any foreign bodies.   ASSESSMENT:  This is a 30 year old African American female who presents  with complaints of left leg pain and swelling. She has evidence for  severe cellulitis of the left lower extremity. She is mildly septic but  not in septic shock. The splinter that she stepped on a couple of weeks  ago could have been the precipitant event, but there are no foreign  bodies identified on x-ray at this time.   PLAN:  1. Cellulitis of the left lower extremity. We will treat this with      vancomycin and Levaquin. This should provide very broad spectrum.      If the patient's leg does not improve, we may have to obtain      surgical consultation. Blood cultures have been sent, and they will      be followed up on. She has mild hypokalemia which will be replaced.      I will hold off on the HCTZ for now and give her fluids instead.   Further management and decisions will depend on the patient's response  to treatment.      Osvaldo Shipper, MD  Electronically Signed     GK/MEDQ  D:  04/16/2008  T:  04/16/2008  Job:  [id number]

## 2011-05-06 NOTE — Group Therapy Note (Signed)
[name], [name]            ACCOUNT NO.:  [id number]   MEDICAL RECORD NO.:  [id number]          PATIENT TYPE:  INP   LOCATION:  A332                          FACILITY:  [facility]   PHYSICIAN:  Osvaldo Shipper, MD     DATE OF BIRTH:  August 16, 1981   DATE OF PROCEDURE:  04/18/2008  DATE OF DISCHARGE:                                 PROGRESS NOTE   SUBJECTIVE:  The patient is feeling better.  The pain in the leg is  better, still about 6/10 in intensity.  She is finding it difficult to  bear weight on her left leg.   OBJECTIVE:  VITAL SIGNS:  She has not had any fever in about a day.  Temperature is 98.5 this morning.  The rest of her vital signs are  stable.  EXTREMITIES:  Examination of the left lower extremity reveals that her  erythema is significantly improved. There is still about a 10-cm area on  the lower leg medially which is still quite erythematous, but I do not  appreciate any fluctuation at this time.  The rest of the erythema is  clearing up quite nicely.  She also had inguinal lymphadenopathy on that  extremity which is also improving.   LABORATORY DATA:  White count is better at 10.6, hemoglobin is stable.  The rest of the labs are quite stable.  Blood cultures have not grown  anything so far.   ASSESSMENT AND PLAN:  1. Left lower extremity cellulitis.  She is improving with intravenous      Levaquin and vancomycin.  This is providing broad-spectrum      coverage.  Her fevers have resolved.  I think she still has a few      more days of treatment that will be needed before she can go home      with oral antibiotics.  2. Hypokalemia has resolved.  3. She has obesity.  4. She has history of fluid overload.   I was told by the nurse that she was refusing her Lovenox.  I have  explained to the patient that she had an inflammatory process.  She is  not ambulating much and she is obese, so she is at risk for DVT, and so  she is strongly recommended to take her Lovenox.   The patient verbalized  understanding and is now going to take her Lovenox.      Osvaldo Shipper, MD  Electronically Signed     GK/MEDQ  D:  04/18/2008  T:  04/18/2008  Job:  [id number]

## 2011-05-06 NOTE — Discharge Summary (Signed)
Cindy [name], BUTSON            ACCOUNT NO.:  [id number]   MEDICAL RECORD NO.:  [id number]          PATIENT TYPE:  INP   LOCATION:  A332                          FACILITY:  [facility]   PHYSICIAN:  Dorris Singh, DO    DATE OF BIRTH:  10-13-1981   DATE OF ADMISSION:  04/16/2008  DATE OF DISCHARGE:  LH                               DISCHARGE SUMMARY   ADMISSION DIAGNOSIS:  Left lower extremity cellulitis and history of  fluid retention.   DISCHARGE DIAGNOSIS:  Left lower leg cellulitis and history of fluid  retention.   PRIMARY CARE PHYSICIAN:  The patient goes to a clinic in [place] for  her knees.  She does not have a primary care physician up here.   STUDIES:  On April 14, 2008 she had a two-view of her foot which showed  marked soft tissue swelling, no acute osseous findings.  She also had a  left foot complete which showed worsening soft tissue swelling,  radiopaque foreign body or visible osseous abnormalities.  This was on  April 16, 2008.  She had a tib-fib on April 16, 2008 which demonstrated  marked tissue swelling without apparent osseous findings and a chest x-  ray on April 19, 2008 which showed placement of the PICC line in the  proper placement.   HISTORY AND PHYSICAL:  Her H&P was done by Dr. Rito [name].  Please refer,  but to summarize, the patient is a 30 year old female who is obese with  history of fluid retention of the lower extremities.  States she removed  a splinter from her foot about 2 weeks ago and then had started to  experience pain, as well started getting swelling.  She was then  admitted to the service of [facility], one for cellulitis.  She was  started on vancomycin and Levaquin.  Her limb was elevated.  Blood  cultures were obtained.  She also had mild hypokalemia which was  replaced when she came, and her HCT was held off and she was given  fluids.  She continued to progress without any problems.  She started to  see a regression of the redness  and the swelling.  Her fevers had  resolved as well.  At one point in time, the patient was refusing the  Lovenox, and it was explained to her that due to the fact that she was  not moving around as much as she should be, that she was stronger  incidence for DVT, so it was recommended that she actually start taking  the Lovenox as well.  On the 29th, the patient was ordered to have a  PICC line placed.  It looked like she would need longer antibiotic  therapy then 3-5 days and would need more than just oral therapy with  the resolution of her cellulitis of his left leg.  A PICC line was  placed without incident, and home health was set up for her to be able  to get vancomycin at home for the next 2 weeks.  The plan will be to set  her up with a primary care physician on call  for her to follow up with  in the next 3-5 days to determine if she is getting better.  At that  point in time, the primary care can evaluate her and determine if she  needs two weeks of therapy if not shorter or longer.  The patient stated  understanding.   DISCHARGE MEDICATIONS:  She will resume her hydrochlorothiazide 12.5 mg  p.o. daily.   ACTIVITY:  She is told to increase her activity slowly, but recommend  that she stay with bed rest until seen by her PCP.  While she is on  bedrest, it is recommended that she take an aspirin daily.  It is  recommended that she elevate her limb.  She does have blister formation  on the leg.  If and when it ruptures, she is instructed to have the home  health care nurse to assist her with keeping it clean and bandaged as  well.   She will be sent home with infusion of vancomycin set for the advanced  care protocol.   DISCHARGE LABS:  White count is 9.9, hemoglobin 11.4, hematocrit 32.7,  platelet count of 249.  Her BMET within normal limits.   CONDITION ON DISCHARGE:  Stable.   DISPOSITION:  To home with explicit instructions of followup and what to  do if the patient's  site begins to enlarge.  She understands that as  well and also importance of followup.  She currently lives in [place]  but is going to be taken care of by her mother, and so at that point in  time she will be up in the [place] area, and so she will follow up  with a PCP here.      Dorris Singh, DO  Electronically Signed     CB/MEDQ  D:  04/20/2008  T:  04/20/2008  Job:  [id number]
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
## 2011-05-09 NOTE — Op Note (Signed)
NAME:  Cindy [name], Cindy [name]                      ACCOUNT NO.:  [id number]   MEDICAL RECORD NO.:  [id number]                   PATIENT TYPE:  EMS   LOCATION:  CARD                                 FACILITY:  [facility]   PHYSICIAN:  Dionne Ano. Everlene Other, M.D.         DATE OF BIRTH:  10-Apr-1981   DATE OF PROCEDURE:  DATE OF DISCHARGE:                                 OPERATIVE REPORT   PREOPERATIVE DIAGNOSIS:  Displaced left index finger, proximal phalanx  fracture.   POSTOPERATIVE DIAGNOSIS:  Displaced left index finger, proximal phalanx  fracture.   PROCEDURE:  1.  Closed reduction and percutaneous pinning of left proximal phalanx      fracture about the left index finger, left upper extremity.  2.  Stress radiography.   SURGEON:  Dionne Ano. Amanda Pea, M.D.   ASSISTANT:  Karie Chimera, P.A.-C.   COMPLICATIONS:  None.   ANESTHESIA:  General anesthetic.   TOURNIQUET TIME:  Zero.   ESTIMATED BLOOD LOSS:  None.   INDICATIONS FOR PROCEDURE:  The patient is a 30 year old female who presents  with the above-mentioned diagnosis.  I have counseled her regarding risks  and benefits of surgery, including the risks of infection, bleeding,  anesthesia, damage to normal structures, and failure of surgery to  accomplish its intended goals of relieving symptoms and restoring function.  With this in mind, the patient desires to proceed.  All questions had been  encouraged and answered preoperatively.   DESCRIPTION OF PROCEDURE:  The patient was taken to the operating suite  where she underwent induction of general anesthesia under the direction of  Dr. Okey Dupre.  Following this, she was prepped and draped in the usual sterile  fashion about the left upper extremity.  Once a sterile field was secured,  she underwent manipulative reduction, and two 0.045 K-wires were placed in  an Eaton-Belsky technique just off of the MCP joint region.  Once this was  done, I then performed stress radiography,  revealing excellent position of  the K-wires which were intermedullary in nature.  The patient tolerated this  well.  There were no complicating features.  Once this was noted and stress  radiography was performed, I then placed the PIP and MCP joints through a  range of motion which was excellent.  The fracture was fixated nicely, and  we then clipped the pins, bent them to 90 degrees, capped them, and placed  Xeroform followed by Marcaine for postoperative analgesia in the skin edge  and then placed the patient in a plaster splint well-bonded to my  satisfaction.   She tolerated the procedure well.  There were no complicating features.  Sponge and instrument counts were reported as correct.  She will be  discharged home on oxycodone, Keflex x5 days, and Robaxin.  She will notify  us if she has any problems, questions, or concerns throughout this period.  Dionne Ano. Everlene Other, M.D.    Nash Mantis  D:  08/18/2004  T:  08/19/2004  Job:  [id number]

## 2011-05-15 ENCOUNTER — Emergency Department (HOSPITAL_COMMUNITY)
Admission: EM | Admit: 2011-05-15 | Discharge: 2011-05-16 | Disposition: A | Discharge status: Home / Self Care | Payer: Medicaid Other | Attending: Emergency Medicine | Admitting: Emergency Medicine

## 2011-05-15 DIAGNOSIS — R61 Generalized hyperhidrosis: Secondary | ICD-10-CM | POA: Insufficient documentation

## 2011-05-15 DIAGNOSIS — G43909 Migraine, unspecified, not intractable, without status migrainosus: Secondary | ICD-10-CM | POA: Insufficient documentation

## 2011-05-15 DIAGNOSIS — H53149 Visual discomfort, unspecified: Secondary | ICD-10-CM | POA: Insufficient documentation

## 2011-05-15 DIAGNOSIS — R197 Diarrhea, unspecified: Secondary | ICD-10-CM | POA: Insufficient documentation

## 2011-05-15 DIAGNOSIS — E876 Hypokalemia: Secondary | ICD-10-CM | POA: Insufficient documentation

## 2011-05-15 DIAGNOSIS — R110 Nausea: Secondary | ICD-10-CM | POA: Insufficient documentation

## 2011-05-15 LAB — CBC
HCT: 35.9 % — ABNORMAL LOW (ref 36.0–46.0)
Hemoglobin: 12.3 g/dL (ref 12.0–15.0)
MCH: 30.4 pg (ref 26.0–34.0)
MCHC: 34.3 g/dL (ref 30.0–36.0)
MCV: 88.9 fL (ref 78.0–100.0)
Platelets: 238 K/uL (ref 150–400)
RBC: 4.04 MIL/uL (ref 3.87–5.11)
RDW: 12.7 % (ref 11.5–15.5)
WBC: 12.2 K/uL — ABNORMAL HIGH (ref 4.0–10.5)

## 2011-05-15 LAB — DIFFERENTIAL
Basophils Absolute: 0 K/uL (ref 0.0–0.1)
Basophils Relative: 0 % (ref 0–1)
Eosinophils Absolute: 0.4 K/uL (ref 0.0–0.7)
Eosinophils Relative: 3 % (ref 0–5)
Lymphocytes Relative: 40 % (ref 12–46)
Lymphs Abs: 4.8 10*3/uL — ABNORMAL HIGH (ref 0.7–4.0)
Monocytes Absolute: 0.9 10*3/uL (ref 0.1–1.0)
Monocytes Relative: 7 % (ref 3–12)
Neutro Abs: 6.1 K/uL (ref 1.7–7.7)
Neutrophils Relative %: 50 % (ref 43–77)

## 2011-05-16 LAB — BASIC METABOLIC PANEL WITH GFR
Chloride: 103 meq/L (ref 96–112)
Creatinine, Ser: 0.52 mg/dL (ref 0.4–1.2)
GFR calc Af Amer: >60 mL/min (ref >60)
Potassium: 3.3 meq/L — ABNORMAL LOW (ref 3.5–5.1)
Sodium: 138 meq/L (ref 135–145)

## 2011-05-16 LAB — BASIC METABOLIC PANEL
BUN: 8 mg/dL (ref 6–23)
CO2: 25 mEq/L (ref 19–32)
Calcium: 9 mg/dL (ref 8.4–10.5)
GFR calc non Af Amer: >60 mL/min (ref >60)
Glucose, Bld: 77 mg/dL (ref 70–99)

## 2011-08-27 ENCOUNTER — Inpatient Hospital Stay (HOSPITAL_COMMUNITY)
Admission: AD | Admit: 2011-08-27 | Discharge: 2011-08-27 | Disposition: A | Discharge status: Home / Self Care | Payer: Self-pay | Source: Ambulatory Visit | Attending: Obstetrics and Gynecology | Admitting: Obstetrics and Gynecology

## 2011-08-27 ENCOUNTER — Encounter (HOSPITAL_COMMUNITY): Payer: Self-pay | Admitting: *Deleted

## 2011-08-27 DIAGNOSIS — R21 Rash and other nonspecific skin eruption: Secondary | ICD-10-CM | POA: Insufficient documentation

## 2011-08-27 HISTORY — DX: Other specified health status: Z78.9

## 2011-08-27 NOTE — ED Provider Notes (Signed)
History     No chief complaint on file.  HPI Cindy [name] 30 y.o. presents to MAU with rash.  She moved over the weekend and since moving has had an itchy rash for 2 days.  It is worsening and client is worried.     OB History    Grav Para Term Preterm Abortions TAB SAB Ect Mult Living   2 2 2       2       Past Medical History  Diagnosis Date  . No pertinent past medical history     Past Surgical History  Procedure Date  . Hand surgery   . Wisdom tooth extraction     No family history on file.  History  Substance Use Topics  . Smoking status: Never Smoker   . Smokeless tobacco: Not on file  . Alcohol Use: Yes    Allergies:  Allergies  Allergen Reactions  . Codeine     REACTION: bad headache    Prescriptions prior to admission  Medication Sig Dispense Refill  . ALPRAZolam (XANAX) 0.25 MG tablet Take 0.25 mg by mouth at bedtime as needed. For anxeity       . escitalopram (LEXAPRO) 10 MG tablet Take 10 mg by mouth daily.        Marland Kitchen levonorgestrel (MIRENA) 20 MCG/24HR IUD 1 each by Intrauterine route once.          Review of Systems  Gastrointestinal: Negative for nausea, vomiting and abdominal pain.  Skin: Positive for itching and rash.  Neurological: Positive for weakness.   Physical Exam   Blood pressure 120/75, pulse 84, temperature 98.6 F (37 C), temperature source Oral, resp. rate 20, height 5\' 7"  (1.702 m), weight 266 lb 2 oz (120.714 kg), last menstrual period 07/29/2011.  Physical Exam  Nursing note and vitals reviewed. Constitutional: She is oriented to person, place, and time. She appears well-developed and well-nourished.       Morbid obesity  HENT:  Head: Normocephalic.  Eyes: EOM are normal.  Neck: Neck supple.  Musculoskeletal: Normal range of motion.  Neurological: She is alert and oriented to person, place, and time.  Skin: Skin is warm and dry. Rash noted.       Client has multiple red spots - nickle sized primarily on left leg,  abdomen and left arm.  Left upper thigh has much larger, fimer reddened areas with edema and significant bruising.  Left big toe has a fluid filled blister with no reddened base.  Reports losing toenail from left second toe this weekend.  Psychiatric: She has a normal mood and affect.    MAU Course  Procedures  MDM Unsure of cause of rash - contact dermatitis, inset bites, inflammatory process, infectious process, or autoimmune disorder.  Explained this is a non GYN problem and would likely take more than one visit to fully evaluate and treat appropriately.  This is a problem her primary care doctor will help her manage.  Plans to see her doctor in the morning.  Client is very stable and this is not an emergency condition tonight.  Will discharge for her to see her primary care doctor in the morning.   Assessment and Plan  Rash with itching  Plan: Use OTC Benadryl one tablet tonight to help her sleep.  See your primary care doctor in the morning.  Cindy [name] 08/27/2011, 3:21 AM   Nolene Bernheim, NP 08/27/11 [phone]

## 2011-08-27 NOTE — Progress Notes (Signed)
Pt presents to mau for a rash and bruising for about 3 days.  Noticed the bruising and swelling tonight. Rash starts on left foot up to left thigh.

## 2011-08-27 NOTE — Progress Notes (Signed)
PT SAYS SHE HAS RASH  L THIGH  WITH BRUISED LINE AROUND  RASH.- STARTED FROM MOSQ BITES. Marland Kitchen STARTED X2 DAYS. ITCHES AT NIGHT.  TOOK LIQUID ZYRTEC.  FEELS KNOTY UNDER RASH .    THIS IS ON HER THIGH AND  L ARM,  SAYS L TOE NAIL FEEL OFF.  SAYS  ITS ALSO IN BETWEEN THIGHS.

## 2011-08-27 NOTE — Progress Notes (Signed)
Nanda Quinton, NP at bedside.  Assessment done and poc discussed with pt.

## 2011-09-16 LAB — BASIC METABOLIC PANEL
BUN: 2 — ABNORMAL LOW
BUN: 5 — ABNORMAL LOW
CO2: 22
CO2: 26
CO2: 28
Calcium: 8.1 — ABNORMAL LOW
Calcium: 8.9
Chloride: 106
Creatinine, Ser: 0.62
Creatinine, Ser: 0.64
Creatinine, Ser: 0.74
GFR calc Af Amer: >60
GFR calc Af Amer: >60
GFR calc non Af Amer: >60
GFR calc non Af Amer: >60
GFR calc non Af Amer: >60
Glucose, Bld: 105 — ABNORMAL HIGH
Glucose, Bld: 120 — ABNORMAL HIGH
Potassium: 3.2 — ABNORMAL LOW
Potassium: 4.2
Sodium: 134 — ABNORMAL LOW
Sodium: 134 — ABNORMAL LOW

## 2011-09-16 LAB — CBC
HCT: 31.5 — ABNORMAL LOW
HCT: 32.5 — ABNORMAL LOW
HCT: 32.7 — ABNORMAL LOW
HCT: 35.2 — ABNORMAL LOW
Hemoglobin: 11.1 — ABNORMAL LOW
Hemoglobin: 11.4 — ABNORMAL LOW
Hemoglobin: 11.4 — ABNORMAL LOW
Hemoglobin: 12.2
MCHC: 34.5
MCHC: 34.9
MCHC: 35.2
MCV: 90.6
Platelets: 179
Platelets: 191
Platelets: 194
Platelets: 230
RBC: 3.61 — ABNORMAL LOW
RBC: 3.92
RDW: 12.6
RDW: 13.1
WBC: 10.6 — ABNORMAL HIGH
WBC: 11.6 — ABNORMAL HIGH
WBC: 13.8 — ABNORMAL HIGH

## 2011-09-16 LAB — DIFFERENTIAL
Basophils Absolute: 0
Basophils Absolute: 0
Basophils Relative: 0
Basophils Relative: 0
Basophils Relative: 0
Eosinophils Absolute: 0.1
Eosinophils Absolute: 0.2
Eosinophils Relative: 1
Eosinophils Relative: 2
Eosinophils Relative: 2
Lymphocytes Relative: 21
Lymphocytes Relative: 28
Lymphs Abs: 2.2
Lymphs Abs: 2.6
Lymphs Abs: 3.2
Monocytes Absolute: 0.5
Monocytes Absolute: 1.5 — ABNORMAL HIGH
Monocytes Relative: 5
Neutro Abs: 13.9 — ABNORMAL HIGH
Neutro Abs: 7.4
Neutrophils Relative %: 67
Neutrophils Relative %: 71
Neutrophils Relative %: 86 — ABNORMAL HIGH

## 2011-09-16 LAB — CULTURE, BLOOD (ROUTINE X 2)
Culture: NO GROWTH
Culture: NO GROWTH
Report Status: 5012009

## 2011-09-16 LAB — VANCOMYCIN, TROUGH: Vancomycin Tr: 9.3

## 2011-09-26 LAB — URINALYSIS, ROUTINE W REFLEX MICROSCOPIC
Bilirubin Urine: NEGATIVE
Glucose, UA: NEGATIVE
Ketones, ur: NEGATIVE
Specific Gravity, Urine: 1.01
pH: 6.5

## 2011-09-26 LAB — WET PREP, GENITAL: Trich, Wet Prep: NONE SEEN

## 2013-06-02 ENCOUNTER — Emergency Department (HOSPITAL_COMMUNITY): Payer: Self-pay

## 2013-06-02 ENCOUNTER — Emergency Department (HOSPITAL_COMMUNITY)
Admission: EM | Admit: 2013-06-02 | Discharge: 2013-06-02 | Disposition: A | Discharge status: Home / Self Care | Payer: Self-pay | Attending: Emergency Medicine | Admitting: Emergency Medicine

## 2013-06-02 ENCOUNTER — Encounter (HOSPITAL_COMMUNITY): Payer: Self-pay | Admitting: Emergency Medicine

## 2013-06-02 DIAGNOSIS — Y929 Unspecified place or not applicable: Secondary | ICD-10-CM | POA: Insufficient documentation

## 2013-06-02 DIAGNOSIS — Y9389 Activity, other specified: Secondary | ICD-10-CM | POA: Insufficient documentation

## 2013-06-02 DIAGNOSIS — T148XXA Other injury of unspecified body region, initial encounter: Secondary | ICD-10-CM

## 2013-06-02 DIAGNOSIS — W268XXA Contact with other sharp object(s), not elsewhere classified, initial encounter: Secondary | ICD-10-CM | POA: Insufficient documentation

## 2013-06-02 DIAGNOSIS — S91309A Unspecified open wound, unspecified foot, initial encounter: Secondary | ICD-10-CM | POA: Insufficient documentation

## 2013-06-02 MED ORDER — NAPROXEN 500 MG PO TABS: 500.0000 mg | ORAL_TABLET | Freq: Two times a day (BID) | ORAL | Status: DC

## 2013-06-02 MED ORDER — CIPROFLOXACIN HCL 500 MG PO TABS: 500.0000 mg | ORAL_TABLET | Freq: Two times a day (BID) | ORAL | Status: DC

## 2013-06-02 MED ORDER — CEPHALEXIN 500 MG PO CAPS: 500.0000 mg | ORAL_CAPSULE | Freq: Two times a day (BID) | ORAL | Status: DC

## 2013-06-02 NOTE — ED Notes (Signed)
Pt soaking R foot in warm water and betadine solution.
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
## 2013-06-02 NOTE — ED Provider Notes (Signed)
History  This chart was scribed for non-physician practitioner Arthor Captain, PA-C working with Richardean Canal, MD, by Candelaria Stagers, ED Scribe. This patient was seen in room WTR9/WTR9 and the patient's care was started at 8:30 PM   CSN: [id number]  Arrival date & time 06/02/13  1825   First MD Initiated Contact with Patient 06/02/13 1955      Chief Complaint  Patient presents with  . Laceration     The history is provided by the patient. No language interpreter was used.   HPI Comments: Cindy [name] is a 32 y.o. female who presents to the Emergency Department complaining of a laceration to the sole of her right foot after she stepped on broken glass earlier today.  Active bleeding.  Pt has cleaned the area.    Past Medical History  Diagnosis Date  . No pertinent past medical history     Past Surgical History  Procedure Laterality Date  . Hand surgery    . Wisdom tooth extraction      No family history on file.  History  Substance Use Topics  . Smoking status: Never Smoker   . Smokeless tobacco: Not on file  . Alcohol Use: Yes    OB History   Grav Para Term Preterm Abortions TAB SAB Ect Mult Living   2 2 2       2       Review of Systems  Skin: Positive for wound (laceration to sole of right foot).  All other systems reviewed and are negative.    Allergies  Codeine  Home Medications   Current Outpatient Rx  Name  Route  Sig  Dispense  Refill  . ALPRAZolam (XANAX) 0.25 MG tablet   Oral   Take 0.25 mg by mouth at bedtime as needed. For anxeity          . escitalopram (LEXAPRO) 10 MG tablet   Oral   Take 10 mg by mouth daily.           Marland Kitchen levonorgestrel (MIRENA) 20 MCG/24HR IUD   Intrauterine   1 each by Intrauterine route once.             BP 111/65  Pulse 76  Temp(Src) 98.5 F (36.9 C) (Oral)  Resp 16  SpO2 100%  LMP 05/04/2013  Physical Exam  Nursing note and vitals reviewed. Constitutional: She is oriented to person, place,  and time. She appears well-developed and well-nourished. No distress.  HENT:  Head: Normocephalic and atraumatic.  Eyes: EOM are normal.  Neck: Neck supple. No tracheal deviation present.  Cardiovascular: Normal rate.   Pulmonary/Chest: Effort normal. No respiratory distress.  Musculoskeletal: Normal range of motion.  Neurological: She is alert and oriented to person, place, and time.  Skin: Skin is warm and dry.  Avulsion with a puncture wounds about 2 x 2 cm in length.      Psychiatric: She has a normal mood and affect. Her behavior is normal.    ED Course  Procedures   DIAGNOSTIC STUDIES: Oxygen Saturation is 100% on room air, normal by my interpretation.    COORDINATION OF CARE:  8:32 PM Discussed course of care with pt which includes wound care and antibiotics.  Pt understands and agrees.   Labs Reviewed - No data to display Dg Foot Complete Right  06/02/2013   *RADIOLOGY REPORT*  Clinical Data: Right mid plantar foot glass laceration.  RIGHT FOOT COMPLETE - 3+ VIEW  Comparison: None.  Findings:  Small focal area of soft tissue thickening involving the skin of the plantar aspect of the midfoot.  No fracture or radiopaque foreign body seen.  IMPRESSION: No fracture or radiopaque foreign body.   Original Report Authenticated By: Beckie Salts, M.D.     1. Puncture wound       MDM  Patient with puncture wound to bottom of the foot. No signs of retained glass on xray. I have discussed that there is risk of small retained glass particles not visible on xray. We will leave the wound open and treat with keflex and cipro to cover for pseudomonas. Patient wound cleansed thoroughly. sterile bandage. Post-op shoe and crutches.  Wound recheck in 2-5 days with PCP or sooner for signs of infection. The patient appears reasonably screened and/or stabilized for discharge and I doubt any other medical condition or other [facility] requiring further screening, evaluation, or treatment in the ED at  this time prior to discharge.  I personally performed the services described in this documentation, which was scribed in my presence. The recorded information has been reviewed and is accurate.          Arthor Captain, PA-C 06/03/13 1608

## 2013-06-04 ENCOUNTER — Telehealth (HOSPITAL_COMMUNITY): Payer: Self-pay | Admitting: Emergency Medicine

## 2013-06-04 NOTE — ED Provider Notes (Signed)
Medical screening examination/treatment/procedure(s) were performed by non-physician practitioner and as supervising physician I was immediately available for consultation/collaboration.   David H Yao, MD 06/04/13 1500 

## 2013-08-10 ENCOUNTER — Emergency Department (HOSPITAL_COMMUNITY)
Admission: EM | Admit: 2013-08-10 | Discharge: 2013-08-10 | Disposition: A | Discharge status: Home / Self Care | Payer: Medicaid Other | Attending: Emergency Medicine | Admitting: Emergency Medicine

## 2013-08-10 ENCOUNTER — Encounter (HOSPITAL_COMMUNITY): Payer: Self-pay | Admitting: Emergency Medicine

## 2013-08-10 ENCOUNTER — Emergency Department (HOSPITAL_COMMUNITY): Payer: Medicaid Other

## 2013-08-10 DIAGNOSIS — IMO0002 Reserved for concepts with insufficient information to code with codable children: Secondary | ICD-10-CM | POA: Insufficient documentation

## 2013-08-10 DIAGNOSIS — Y939 Activity, unspecified: Secondary | ICD-10-CM | POA: Insufficient documentation

## 2013-08-10 DIAGNOSIS — H1130 Conjunctival hemorrhage, unspecified eye: Secondary | ICD-10-CM | POA: Insufficient documentation

## 2013-08-10 DIAGNOSIS — H538 Other visual disturbances: Secondary | ICD-10-CM | POA: Insufficient documentation

## 2013-08-10 DIAGNOSIS — H1132 Conjunctival hemorrhage, left eye: Secondary | ICD-10-CM

## 2013-08-10 DIAGNOSIS — Y929 Unspecified place or not applicable: Secondary | ICD-10-CM | POA: Insufficient documentation

## 2013-08-10 DIAGNOSIS — H5712 Ocular pain, left eye: Secondary | ICD-10-CM

## 2013-08-10 DIAGNOSIS — S0510XA Contusion of eyeball and orbital tissues, unspecified eye, initial encounter: Secondary | ICD-10-CM | POA: Insufficient documentation

## 2013-08-10 DIAGNOSIS — S0512XA Contusion of eyeball and orbital tissues, left eye, initial encounter: Secondary | ICD-10-CM

## 2013-08-10 MED ORDER — OXYCODONE-ACETAMINOPHEN 5-325 MG PO TABS
1.0000 | ORAL_TABLET | Freq: Once | ORAL | Status: AC
  Administered 2013-08-10: 1 via ORAL
  Filled 2013-08-10: qty 1

## 2013-08-10 MED ORDER — FLUORESCEIN SODIUM 1 MG OP STRP
1.0000 | ORAL_STRIP | Freq: Once | OPHTHALMIC | Status: DC
  Filled 2013-08-10: qty 2

## 2013-08-10 MED ORDER — OXYCODONE-ACETAMINOPHEN 5-325 MG PO TABS: 1.0000 | ORAL_TABLET | ORAL | Status: DC | PRN

## 2013-08-10 MED ORDER — TETRACAINE HCL 0.5 % OP SOLN
1.0000 [drp] | Freq: Once | OPHTHALMIC | Status: DC
  Filled 2013-08-10: qty 2
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
## 2013-08-11 NOTE — ED Provider Notes (Signed)
Medical screening examination/treatment/procedure(s) were performed by non-physician practitioner and as supervising physician I was immediately available for consultation/collaboration.    Ronan Dion R Rayhaan Huster, MD 08/11/13 1538 

## 2013-12-23 ENCOUNTER — Other Ambulatory Visit: Payer: Self-pay | Admitting: Family

## 2013-12-23 DIAGNOSIS — N644 Mastodynia: Secondary | ICD-10-CM

## 2013-12-23 DIAGNOSIS — N6452 Nipple discharge: Secondary | ICD-10-CM

## 2013-12-25 ENCOUNTER — Emergency Department (HOSPITAL_COMMUNITY)
Admission: EM | Admit: 2013-12-25 | Discharge: 2013-12-26 | Disposition: A | Discharge status: Home / Self Care | Payer: Medicaid Other | Attending: Emergency Medicine | Admitting: Emergency Medicine

## 2013-12-25 DIAGNOSIS — Z872 Personal history of diseases of the skin and subcutaneous tissue: Secondary | ICD-10-CM | POA: Insufficient documentation

## 2013-12-25 DIAGNOSIS — R5381 Other malaise: Secondary | ICD-10-CM | POA: Insufficient documentation

## 2013-12-25 DIAGNOSIS — IMO0001 Reserved for inherently not codable concepts without codable children: Secondary | ICD-10-CM | POA: Insufficient documentation

## 2013-12-25 DIAGNOSIS — R5383 Other fatigue: Secondary | ICD-10-CM

## 2013-12-25 DIAGNOSIS — R209 Unspecified disturbances of skin sensation: Secondary | ICD-10-CM | POA: Insufficient documentation

## 2013-12-25 DIAGNOSIS — J069 Acute upper respiratory infection, unspecified: Secondary | ICD-10-CM | POA: Insufficient documentation

## 2013-12-25 DIAGNOSIS — R110 Nausea: Secondary | ICD-10-CM | POA: Insufficient documentation

## 2013-12-25 HISTORY — DX: Cellulitis of unspecified part of limb: L03.119

## 2013-12-26 ENCOUNTER — Emergency Department (HOSPITAL_COMMUNITY): Payer: Medicaid Other

## 2013-12-26 ENCOUNTER — Encounter (HOSPITAL_COMMUNITY): Payer: Self-pay | Admitting: Emergency Medicine

## 2013-12-26 MED ORDER — KETOROLAC TROMETHAMINE 30 MG/ML IJ SOLN
30.0000 mg | Freq: Once | INTRAMUSCULAR | Status: AC
  Administered 2013-12-26: 30 mg via INTRAMUSCULAR

## 2013-12-26 MED ORDER — KETOROLAC TROMETHAMINE 30 MG/ML IJ SOLN
INTRAMUSCULAR | Status: AC
  Filled 2013-12-26: qty 1

## 2013-12-26 NOTE — ED Provider Notes (Signed)
CSN: [id number]     Arrival date & time 12/25/13  2357 History   First MD Initiated Contact with Patient 12/26/13 0028     No chief complaint on file.  (Consider location/radiation/quality/duration/timing/severity/associated sxs/prior Treatment) HPI  This is a 33 year old female who presents with chest pain, tingling around her lips and her hands. Patient reports that she got "sick" on NYE. She reports chills, generalized fatigue, myalgias, nausea, and shortness of breath. Patient states she began to feel better but then today she felt like her breathing change. She describes "a rattling in my chest." She reports that it hurts to breathe in and out and with cough.  Patient denies any recent fevers. Currently her pain is rated at 5/10.  Past Medical History  Diagnosis Date  . No pertinent past medical history   . Cellulitis of calf    Past Surgical History  Procedure Laterality Date  . Hand surgery    . Wisdom tooth extraction     History reviewed. No pertinent family history. History  Substance Use Topics  . Smoking status: Never Smoker   . Smokeless tobacco: Not on file  . Alcohol Use: Yes   OB History   Grav Para Term Preterm Abortions TAB SAB Ect Mult Living   2 2 2       2      Review of Systems  Constitutional: Positive for chills. Negative for fever.  Respiratory: Positive for cough and shortness of breath. Negative for chest tightness.   Cardiovascular: Positive for chest pain. Negative for leg swelling.  Gastrointestinal: Negative for nausea, vomiting and abdominal pain.  Genitourinary: Negative for dysuria.  Musculoskeletal: Positive for myalgias. Negative for back pain.  Skin: Negative for rash.  Neurological: Negative for headaches.  Psychiatric/Behavioral: Negative for confusion.  All other systems reviewed and are negative.    Allergies  Codeine  Home Medications   Current Outpatient Rx  Name  Route  Sig  Dispense  Refill  . dextromethorphan-guaiFENesin  (MUCINEX DM) 30-600 MG per 12 hr tablet   Oral   Take 1 tablet by mouth 2 (two) times daily as needed for cough (cough).         Marland Kitchen levonorgestrel (MIRENA) 20 MCG/24HR IUD   Intrauterine   1 each by Intrauterine route once.            BP 110/63  Pulse 72  Temp(Src) 97.8 F (36.6 C) (Oral)  Resp 18  Ht 5\' 8"  (1.727 m)  Wt 265 lb 9.6 oz (120.475 kg)  BMI 40.39 kg/m2  SpO2 94% Physical Exam  Nursing note and vitals reviewed. Constitutional: She is oriented to person, place, and time. She appears well-developed and well-nourished. No distress.  HENT:  Head: Normocephalic and atraumatic.  Mouth/Throat: Oropharynx is clear and moist. No oropharyngeal exudate.  Eyes: Pupils are equal, round, and reactive to light.  Neck: Neck supple.  Cardiovascular: Normal rate, regular rhythm and normal heart sounds.   No murmur heard. Pulmonary/Chest: Effort normal and breath sounds normal. No respiratory distress. She has no wheezes. She exhibits tenderness.  Abdominal: Soft. Bowel sounds are normal. There is no tenderness.  Musculoskeletal: She exhibits no edema.  Lymphadenopathy:    She has no cervical adenopathy.  Neurological: She is alert and oriented to person, place, and time.  Skin: Skin is warm and dry.  Psychiatric: She has a normal mood and affect.    ED Course  Procedures (including critical care time) Labs Review Labs Reviewed - No data  to display Imaging Review Dg Chest 2 View  12/26/2013   CLINICAL DATA:  Shortness of breath and chest pain for 1 week  EXAM: CHEST  2 VIEW  COMPARISON:  DG CHEST 1V PORT dated 04/19/2008  FINDINGS: The heart size and mediastinal contours are within normal limits. Both lungs are clear. The visualized skeletal structures are unremarkable.  IMPRESSION: No active cardiopulmonary disease.   Electronically Signed   By: Britta MccreedySusan  Turner M.D.   On: 12/26/2013 02:00    EKG Interpretation   None       MDM   1. Upper respiratory infection     Patient presents with multiple complaints. She is nontoxic-appearing on exam and her vital signs are within normal limits. Patient has tenderness to palpation over anterior chest. Pulmonary exam is benign. Patient's constellation of symptoms most consistent with viral syndrome. However, given patient's subjective change in breathing will obtain a chest x-ray. Patient has reproducible chest pain and have low suspicion for cardiac etiology or PE (PERC neg).  Patient reassured. She has a primary care physician and was given strict return precautions.  After history, exam, and medical workup I feel the patient has been appropriately medically screened and is safe for discharge home. Pertinent diagnoses were discussed with the patient. Patient was given return precautions.     Shon Batonourtney F Aquarius Tremper, MD 12/26/13 [phone]

## 2013-12-26 NOTE — Discharge Instructions (Signed)

## 2013-12-26 NOTE — ED Notes (Signed)
Patient is alert and oriented x3.  She states that she is feeling odd with inhalation. She additionally states that she has numbness around her lip and odd tasting phlegm.  Patient states that she has pain in her mid back that currently she rates 5 of 10.

## 2013-12-28 ENCOUNTER — Other Ambulatory Visit: Payer: Self-pay | Admitting: Family

## 2013-12-28 DIAGNOSIS — N6310 Unspecified lump in the right breast, unspecified quadrant: Secondary | ICD-10-CM

## 2013-12-28 DIAGNOSIS — N644 Mastodynia: Secondary | ICD-10-CM

## 2013-12-28 DIAGNOSIS — N6452 Nipple discharge: Secondary | ICD-10-CM

## 2014-01-16 ENCOUNTER — Other Ambulatory Visit: Payer: Medicaid Other

## 2014-06-07 ENCOUNTER — Inpatient Hospital Stay (HOSPITAL_COMMUNITY)
Admission: AD | Admit: 2014-06-07 | Discharge: 2014-06-07 | Disposition: A | Discharge status: Home / Self Care | Payer: Medicaid Other | Source: Ambulatory Visit | Attending: Obstetrics & Gynecology | Admitting: Obstetrics & Gynecology

## 2014-06-07 ENCOUNTER — Encounter (HOSPITAL_COMMUNITY): Payer: Self-pay | Admitting: *Deleted

## 2014-06-07 DIAGNOSIS — L03319 Cellulitis of trunk, unspecified: Principal | ICD-10-CM

## 2014-06-07 DIAGNOSIS — L02213 Cutaneous abscess of chest wall: Secondary | ICD-10-CM

## 2014-06-07 DIAGNOSIS — L02219 Cutaneous abscess of trunk, unspecified: Secondary | ICD-10-CM | POA: Insufficient documentation

## 2014-06-07 HISTORY — DX: Urinary tract infection, site not specified: N39.0

## 2014-06-07 HISTORY — DX: Headache: R51

## 2014-06-07 MED ORDER — SULFAMETHOXAZOLE-TRIMETHOPRIM 800-160 MG PO TABS
1.0000 | ORAL_TABLET | Freq: Two times a day (BID) | ORAL | Status: AC
Start: 2014-06-07 — End: 2014-06-14
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
## 2014-06-07 NOTE — Discharge Instructions (Signed)
Abscess An abscess (boil or furuncle) is an infected area on or under the skin. This area is filled with yellowish-white fluid (pus) and other material (debris). HOME CARE   Only take medicines as told by your doctor.  If you were given antibiotic medicine, take it as directed. Finish the medicine even if you start to feel better.  If gauze is used, follow your doctor's directions for changing the gauze.  To avoid spreading the infection:  Keep your abscess covered with a bandage.  Wash your hands well.  Do not share personal care items, towels, or whirlpools with others.  Avoid skin contact with others.  Keep your skin and clothes clean around the abscess.  Keep all doctor visits as told. GET HELP RIGHT AWAY IF:   You have more pain, puffiness (swelling), or redness in the wound site.  You have more fluid or blood coming from the wound site.  You have muscle aches, chills, or you feel sick.  You have a fever. MAKE SURE YOU:   Understand these instructions.  Will watch your condition.  Will get help right away if you are not doing well or get worse. Document Released: 05/26/2008 Document Revised: 06/08/2012 Document Reviewed: 02/20/2012 [facility] Patient Information 2015 [place], [place]. This information is not intended to replace advice given to you by your health care provider. Make sure you discuss any questions you have with your health care provider.  Warm Compresses should be placed x 15 minutes twice daily to help promote drainage Wet a washcloth and place in the Microwave x 15 seconds to heat. Do not place on skin if too hot to touch.

## 2014-06-07 NOTE — MAU Note (Signed)
Noted a "lump" under R breast. Started as tiny "nodule" and was lower midline. Then she did not feel anymore. Was told was probably calcification. Started feeling it again this week and is more to lower inner portion of breast.  States it is turning dark. Now is feeling pain. R arm feels strange. Has had discharge from R breast off and on for years, none today.

## 2014-06-15 ENCOUNTER — Encounter: Payer: Self-pay | Admitting: Obstetrics and Gynecology

## 2014-06-29 ENCOUNTER — Encounter: Payer: Self-pay | Admitting: *Deleted

## 2014-07-24 ENCOUNTER — Encounter: Payer: Medicaid Other | Admitting: Obstetrics & Gynecology

## 2014-08-02 ENCOUNTER — Encounter: Payer: Self-pay | Admitting: Obstetrics and Gynecology

## 2014-08-02 ENCOUNTER — Ambulatory Visit (INDEPENDENT_AMBULATORY_CARE_PROVIDER_SITE_OTHER): Payer: Medicaid Other | Admitting: Obstetrics and Gynecology

## 2014-08-02 VITALS — BP 117/58 | HR 92 | Temp 98.2°F | Ht 68.0 in | Wt 256.7 lb

## 2014-08-02 DIAGNOSIS — Z302 Encounter for sterilization: Secondary | ICD-10-CM

## 2014-08-02 DIAGNOSIS — Z3009 Encounter for other general counseling and advice on contraception: Secondary | ICD-10-CM

## 2014-08-02 DIAGNOSIS — Z01818 Encounter for other preprocedural examination: Secondary | ICD-10-CM

## 2014-08-02 NOTE — Progress Notes (Signed)
Patient ID: Cathrine MusterChristina E Baria, female   DOB: 09/13/1981, 33 y.o.   MRN: [id number] 33 yo G2P2 presenting today requesting permanent sterilization. Patient is sexually active currently using IUD for contraception which is due to be removed.   Past Medical History  Diagnosis Date  . No pertinent past medical history   . Cellulitis of calf   . Headache(784.0)   . Vaginal Pap smear, abnormal   . UTI (lower urinary tract infection)    Past Surgical History  Procedure Laterality Date  . Hand surgery    . Wisdom tooth extraction    . Leep     History reviewed. No pertinent family history. History  Substance Use Topics  . Smoking status: Never Smoker   . Smokeless tobacco: Never Used  . Alcohol Use: Yes     Comment: Social, occas.   GENERAL: Well-developed, well-nourished female in no acute distress.  LUNGS: Clear to auscultation bilaterally.  HEART: Regular rate and rhythm. ABDOMEN: Soft, nontender, nondistended. No organomegaly. PELVIC: Not indicated EXTREMITIES: No cyanosis, clubbing, or edema, 2+ distal pulses.   A/P 33 yo here for permanent sterilization consult - Risks, benefits and alternatives were explained including but not limited to risks of bleeding, infection and damage to adjacent organs. Patient verbalized understanding and all questions were answered - patient will be scheduled for laparoscopic bilateral salpingectomy

## 2014-08-10 ENCOUNTER — Encounter: Payer: Self-pay | Admitting: *Deleted

## 2014-09-05 ENCOUNTER — Encounter (HOSPITAL_COMMUNITY): Admission: RE | Payer: Self-pay | Source: Ambulatory Visit

## 2014-09-05 ENCOUNTER — Ambulatory Visit (HOSPITAL_COMMUNITY)
Admission: RE | Admit: 2014-09-05 | Payer: Medicaid Other | Source: Ambulatory Visit | Admitting: Obstetrics and Gynecology

## 2014-09-05 SURGERY — SALPINGECTOMY, BILATERAL, LAPAROSCOPIC
Anesthesia: Choice | Site: Abdomen | Laterality: Bilateral

## 2014-10-22 ENCOUNTER — Emergency Department (HOSPITAL_COMMUNITY)
Admission: EM | Admit: 2014-10-22 | Discharge: 2014-10-22 | Disposition: A | Discharge status: Home / Self Care | Payer: Medicaid Other | Attending: Emergency Medicine | Admitting: Emergency Medicine

## 2014-10-22 ENCOUNTER — Encounter (HOSPITAL_COMMUNITY): Payer: Self-pay

## 2014-10-22 DIAGNOSIS — Z79899 Other long term (current) drug therapy: Secondary | ICD-10-CM | POA: Insufficient documentation

## 2014-10-22 DIAGNOSIS — Z87440 Personal history of urinary (tract) infections: Secondary | ICD-10-CM | POA: Insufficient documentation

## 2014-10-22 DIAGNOSIS — Y9301 Activity, walking, marching and hiking: Secondary | ICD-10-CM | POA: Insufficient documentation

## 2014-10-22 DIAGNOSIS — S0181XA Laceration without foreign body of other part of head, initial encounter: Secondary | ICD-10-CM | POA: Insufficient documentation

## 2014-10-22 DIAGNOSIS — W19XXXA Unspecified fall, initial encounter: Secondary | ICD-10-CM

## 2014-10-22 DIAGNOSIS — W01198A Fall on same level from slipping, tripping and stumbling with subsequent striking against other object, initial encounter: Secondary | ICD-10-CM | POA: Insufficient documentation

## 2014-10-22 DIAGNOSIS — Z23 Encounter for immunization: Secondary | ICD-10-CM | POA: Insufficient documentation

## 2014-10-22 DIAGNOSIS — Y9289 Other specified places as the place of occurrence of the external cause: Secondary | ICD-10-CM | POA: Insufficient documentation

## 2014-10-22 DIAGNOSIS — Z872 Personal history of diseases of the skin and subcutaneous tissue: Secondary | ICD-10-CM | POA: Insufficient documentation

## 2014-10-22 MED ORDER — TETANUS-DIPHTH-ACELL PERTUSSIS 5-2.5-18.5 LF-MCG/0.5 IM SUSP
0.5000 mL | Freq: Once | INTRAMUSCULAR | Status: AC
  Administered 2014-10-22: 0.5 mL via INTRAMUSCULAR
  Filled 2014-10-22: qty 0.5

## 2014-10-22 MED ORDER — LIDOCAINE-EPINEPHRINE (PF) 2 %-1:200000 IJ SOLN
20.0000 mL | Freq: Once | INTRAMUSCULAR | Status: AC
  Administered 2014-10-22: 20 mL
  Filled 2014-10-22: qty 10

## 2014-10-22 MED ORDER — IBUPROFEN 800 MG PO TABS
800.0000 mg | ORAL_TABLET | Freq: Once | ORAL | Status: AC
  Administered 2014-10-22: 800 mg via ORAL
  Filled 2014-10-22: qty 1

## 2014-10-22 NOTE — ED Notes (Signed)
Bed: WA09 Expected date:  Expected time:  Means of arrival:  Comments: 

## 2014-10-22 NOTE — ED Notes (Signed)
Pt states that she tripped over her dress and hit her head on the corner of her steps, she has a laceration above her right eye, bleeding controlled

## 2014-10-22 NOTE — ED Notes (Signed)
PA at bedside.

## 2014-10-22 NOTE — ED Provider Notes (Signed)
CSN: [id number]     Arrival date & time 10/22/14  0612 History   First MD Initiated Contact with Patient 10/22/14 0645     Chief Complaint  Patient presents with  . Facial Laceration     (Consider location/radiation/quality/duration/timing/severity/associated sxs/prior Treatment) HPI   Patient presents with laceration to right forehead/eyebrow that occurred at approximately 5:30am today.  States she was walking up the steps and her heel got caught on the dress she was wearing.  Her hands were full and she fell forward and was unable to stop herself.  Hit her forehead on the concrete steps.  Was able to control the bleeding then came to ED.  Unknown last tetanus.  Has a headache.  Denies vision changes, difficulty moving eyes, focal neurologic deficits, neck pain, vomiting.    Past Medical History  Diagnosis Date  . No pertinent past medical history   . Cellulitis of calf   . Headache(784.0)   . Vaginal Pap smear, abnormal   . UTI (lower urinary tract infection)    Past Surgical History  Procedure Laterality Date  . Hand surgery    . Wisdom tooth extraction    . Leep     History reviewed. No pertinent family history. History  Substance Use Topics  . Smoking status: Never Smoker   . Smokeless tobacco: Never Used  . Alcohol Use: Yes     Comment: Social, occas.   OB History    Gravida Para Term Preterm AB TAB SAB Ectopic Multiple Living   2 2 2       2      Review of Systems  Constitutional: Negative for fever.  Eyes: Negative for pain and visual disturbance.  Musculoskeletal: Negative for back pain and neck pain.  Skin: Positive for wound.  Allergic/Immunologic: Negative for immunocompromised state.  Neurological: Negative for dizziness, syncope, weakness and numbness.  Hematological: Does not bruise/bleed easily.  Psychiatric/Behavioral: Negative for self-injury.      Allergies  Codeine and Mushroom extract complex  Home Medications   Prior to Admission  medications   Medication Sig Start Date End Date Taking? Authorizing Provider  Cholecalciferol (VITAMIN D-3) 5000 UNITS TABS Take 10,000 Units by mouth daily.   Yes Historical Provider, MD  ibuprofen (ADVIL,MOTRIN) 800 MG tablet Take 400 mg by mouth every 8 (eight) hours as needed for headache.   Yes Historical Provider, MD  Multiple Vitamin (MULTIVITAMIN WITH MINERALS) TABS tablet Take 1 tablet by mouth daily.   Yes Historical Provider, MD  bismuth subsalicylate (PEPTO BISMOL) 262 MG/15ML suspension Take 30 mLs by mouth every 6 (six) hours as needed for indigestion.    Historical Provider, MD  levonorgestrel (MIRENA) 20 MCG/24HR IUD 1 each by Intrauterine route once.      Historical Provider, MD   BP 117/63 mmHg  Pulse 79  Temp(Src) 97.9 F (36.6 C) (Oral)  Resp 20  Ht 5\' 8"  (1.727 m)  Wt 252 lb (114.306 kg)  BMI 38.33 kg/m2  SpO2 99% Physical Exam  Constitutional: She appears well-developed and well-nourished. No distress.  HENT:  Head: Normocephalic.    Neck: Neck supple.  Pulmonary/Chest: Effort normal.  Neurological: She is alert. GCS eye subscore is 4. GCS verbal subscore is 5. GCS motor subscore is 6.  CN II-XII intact, EOMs intact, no pronator drift, grip strengths equal bilaterally; strength 5/5 in all extremities, sensation intact in all extremities; finger to nose, heel to shin, rapid alternating movements normal.  Skin: She is not diaphoretic.  Nursing  note and vitals reviewed.   ED Course  Procedures (including critical care time) Labs Review Labs Reviewed - No data to display  Imaging Review No results found.   EKG Interpretation None       LACERATION REPAIR Performed by: Trixie DredgeWEST, Maykayla Highley Authorized by: Trixie DredgeWEST, Anndrea Mihelich Consent: Verbal consent obtained. Risks and benefits: risks, benefits and alternatives were discussed Consent given by: patient Patient identity confirmed: provided demographic data Prepped and Draped in normal sterile fashion Wound  explored  Laceration Location: right eyebrow  Laceration Length: 5cm  No Foreign Bodies seen or palpated  Anesthesia: local infiltration  Local anesthetic: lidocaine 2% with epinephrine  Anesthetic total: 3 ml  Irrigation method: syringe Amount of cleaning: standard  Skin closure: 5-0 vicryl rapide  Number of sutures: 5  Technique: simple interrupted  Patient tolerance: Patient tolerated the procedure well with no immediate complications.    MDM   Final diagnoses:  Forehead laceration, initial encounter  Fall, initial encounter    Afebrile, nontoxic patient with laceration to right eyebrow/forehead after mechanical fall.  Neurologically intact.  Sutures placed in ED.   D/C home with pcp/ed follow up.  Discussed home wound care.   Discussed result, findings, treatment, and follow up  with patient.  Pt given return precautions.  Pt verbalizes understanding and agrees with plan.         Trixie Dredgemily Shaquita Fort, PA-C 10/22/14 1145

## 2014-10-22 NOTE — Discharge Instructions (Signed)
Read the information below.  You may return to the Emergency Department at any time for worsening condition or any new symptoms that concern you.  If you develop redness, swelling, pus draining from the wound, severe pain, or fevers greater than 100.4, return to the ER immediately for a recheck.     Laceration Care, Adult A laceration is a cut or lesion that goes through all layers of the skin and into the tissue just beneath the skin. TREATMENT  Some lacerations may not require closure. Some lacerations may not be able to be closed due to an increased risk of infection. It is important to see your caregiver as soon as possible after an injury to minimize the risk of infection and maximize the opportunity for successful closure. If closure is appropriate, pain medicines may be given, if needed. The wound will be cleaned to help prevent infection. Your caregiver will use stitches (sutures), staples, wound glue (adhesive), or skin adhesive strips to repair the laceration. These tools bring the skin edges together to allow for faster healing and a better cosmetic outcome. However, all wounds will heal with a scar. Once the wound has healed, scarring can be minimized by covering the wound with sunscreen during the day for 1 full year. HOME CARE INSTRUCTIONS  For sutures or staples:  Keep the wound clean and dry.  If you were given a bandage (dressing), you should change it at least once a day. Also, change the dressing if it becomes wet or dirty, or as directed by your caregiver.  Wash the wound with soap and water 2 times a day. Rinse the wound off with water to remove all soap. Pat the wound dry with a clean towel.  After cleaning, apply a thin layer of the antibiotic ointment as recommended by your caregiver. This will help prevent infection and keep the dressing from sticking.  You may shower as usual after the first 24 hours. Do not soak the wound in water until the sutures are removed.  Only  take over-the-counter or prescription medicines for pain, discomfort, or fever as directed by your caregiver.  Get your sutures or staples removed as directed by your caregiver. For skin adhesive strips:  Keep the wound clean and dry.  Do not get the skin adhesive strips wet. You may bathe carefully, using caution to keep the wound dry.  If the wound gets wet, pat it dry with a clean towel.  Skin adhesive strips will fall off on their own. You may trim the strips as the wound heals. Do not remove skin adhesive strips that are still stuck to the wound. They will fall off in time. For wound adhesive:  You may briefly wet your wound in the shower or bath. Do not soak or scrub the wound. Do not swim. Avoid periods of heavy perspiration until the skin adhesive has fallen off on its own. After showering or bathing, gently pat the wound dry with a clean towel.  Do not apply liquid medicine, cream medicine, or ointment medicine to your wound while the skin adhesive is in place. This may loosen the film before your wound is healed.  If a dressing is placed over the wound, be careful not to apply tape directly over the skin adhesive. This may cause the adhesive to be pulled off before the wound is healed.  Avoid prolonged exposure to sunlight or tanning lamps while the skin adhesive is in place. Exposure to ultraviolet light in the first year will  darken the scar.  The skin adhesive will usually remain in place for 5 to 10 days, then naturally fall off the skin. Do not pick at the adhesive film. You may need a tetanus shot if:  You cannot remember when you had your last tetanus shot.  You have never had a tetanus shot. If you get a tetanus shot, your arm may swell, get red, and feel warm to the touch. This is common and not a problem. If you need a tetanus shot and you choose not to have one, there is a rare chance of getting tetanus. Sickness from tetanus can be serious. SEEK MEDICAL CARE IF:    You have redness, swelling, or increasing pain in the wound.  You see a red line that goes away from the wound.  You have yellowish-white fluid (pus) coming from the wound.  You have a fever.  You notice a bad smell coming from the wound or dressing.  Your wound breaks open before or after sutures have been removed.  You notice something coming out of the wound such as wood or glass.  Your wound is on your hand or foot and you cannot move a finger or toe. SEEK IMMEDIATE MEDICAL CARE IF:   Your pain is not controlled with prescribed medicine.  You have severe swelling around the wound causing pain and numbness or a change in color in your arm, hand, leg, or foot.  Your wound splits open and starts bleeding.  You have worsening numbness, weakness, or loss of function of any joint around or beyond the wound.  You develop painful lumps near the wound or on the skin anywhere on your body. MAKE SURE YOU:   Understand these instructions.  Will watch your condition.  Will get help right away if you are not doing well or get worse. Document Released: 12/08/2005 Document Revised: 03/01/2012 Document Reviewed: 06/03/2011 [facility]Exit[facility] Patient Information 2015 [place] Springs[place], [place]. This information is not intended to replace advice given to you by your health care provider. Make sure you discuss any questions you have with your health care provider.   Facial Laceration  A facial laceration is a cut on the face. These injuries can be painful and cause bleeding. Lacerations usually heal quickly, but they need special care to reduce scarring. DIAGNOSIS  Your health care provider will take a medical history, ask for details about how the injury occurred, and examine the wound to determine how deep the cut is. TREATMENT  Some facial lacerations may not require closure. Others may not be able to be closed because of an increased risk of infection. The risk of infection and the chance for successful  closure will depend on various factors, including the amount of time since the injury occurred. The wound may be cleaned to help prevent infection. If closure is appropriate, pain medicines may be given if needed. Your health care provider will use stitches (sutures), wound glue (adhesive), or skin adhesive strips to repair the laceration. These tools bring the skin edges together to allow for faster healing and a better cosmetic outcome. If needed, you may also be given a tetanus shot. HOME CARE INSTRUCTIONS  Only take over-the-counter or prescription medicines as directed by your health care provider.  Follow your health care provider's instructions for wound care. These instructions will vary depending on the technique used for closing the wound. For Sutures:  Keep the wound clean and dry.   If you were given a bandage (dressing), you should change  it at least once a day. Also change the dressing if it becomes wet or dirty, or as directed by your health care provider.   Wash the wound with soap and water 2 times a day. Rinse the wound off with water to remove all soap. Pat the wound dry with a clean towel.   After cleaning, apply a thin layer of the antibiotic ointment recommended by your health care provider. This will help prevent infection and keep the dressing from sticking.   You may shower as usual after the first 24 hours. Do not soak the wound in water until the sutures are removed.   Get your sutures removed as directed by your health care provider. With facial lacerations, sutures should usually be taken out after 4-5 days to avoid stitch marks.   Wait a few days after your sutures are removed before applying any makeup. For Skin Adhesive Strips:  Keep the wound clean and dry.   Do not get the skin adhesive strips wet. You may bathe carefully, using caution to keep the wound dry.   If the wound gets wet, pat it dry with a clean towel.   Skin adhesive strips will  fall off on their own. You may trim the strips as the wound heals. Do not remove skin adhesive strips that are still stuck to the wound. They will fall off in time.  For Wound Adhesive:  You may briefly wet your wound in the shower or bath. Do not soak or scrub the wound. Do not swim. Avoid periods of heavy sweating until the skin adhesive has fallen off on its own. After showering or bathing, gently pat the wound dry with a clean towel.   Do not apply liquid medicine, cream medicine, ointment medicine, or makeup to your wound while the skin adhesive is in place. This may loosen the film before your wound is healed.   If a dressing is placed over the wound, be careful not to apply tape directly over the skin adhesive. This may cause the adhesive to be pulled off before the wound is healed.   Avoid prolonged exposure to sunlight or tanning lamps while the skin adhesive is in place.  The skin adhesive will usually remain in place for 5-10 days, then naturally fall off the skin. Do not pick at the adhesive film.  After Healing: Once the wound has healed, cover the wound with sunscreen during the day for 1 full year. This can help minimize scarring. Exposure to ultraviolet light in the first year will darken the scar. It can take 1-2 years for the scar to lose its redness and to heal completely.  SEEK IMMEDIATE MEDICAL CARE IF:  You have redness, pain, or swelling around the wound.   You see ayellowish-white fluid (pus) coming from the wound.   You have chills or a fever.  MAKE SURE YOU:  Understand these instructions.  Will watch your condition.  Will get help right away if you are not doing well or get worse. Document Released: 01/15/2005 Document Revised: 09/28/2013 Document Reviewed: 07/21/2013 [facility] Patient Information 2015 [place], [place]. This information is not intended to replace advice given to you by your health care provider. Make sure you discuss any questions you  have with your health care provider.  Head Injury You have received a head injury. It does not appear serious at this time. Headaches and vomiting are common following head injury. It should be easy to awaken from sleeping. Sometimes it is necessary  for you to stay in the emergency department for a while for observation. Sometimes admission to the hospital may be needed. After injuries such as yours, most problems occur within the first 24 hours, but side effects may occur up to 7-10 days after the injury. It is important for you to carefully monitor your condition and contact your health care provider or seek immediate medical care if there is a change in your condition. WHAT ARE THE TYPES OF HEAD INJURIES? Head injuries can be as minor as a bump. Some head injuries can be more severe. More severe head injuries include:  A jarring injury to the brain (concussion).  A bruise of the brain (contusion). This mean there is bleeding in the brain that can cause swelling.  A cracked skull (skull fracture).  Bleeding in the brain that collects, clots, and forms a bump (hematoma). WHAT CAUSES A HEAD INJURY? A serious head injury is most likely to happen to someone who is in a car wreck and is not wearing a seat belt. Other causes of major head injuries include bicycle or motorcycle accidents, sports injuries, and falls. HOW ARE HEAD INJURIES DIAGNOSED? A complete history of the event leading to the injury and your current symptoms will be helpful in diagnosing head injuries. Many times, pictures of the brain, such as CT or MRI are needed to see the extent of the injury. Often, an overnight hospital stay is necessary for observation.  WHEN SHOULD I SEEK IMMEDIATE MEDICAL CARE?  You should get help right away if:  You have confusion or drowsiness.  You feel sick to your stomach (nauseous) or have continued, forceful vomiting.  You have dizziness or unsteadiness that is getting worse.  You have  severe, continued headaches not relieved by medicine. Only take over-the-counter or prescription medicines for pain, fever, or discomfort as directed by your health care provider.  You do not have normal function of the arms or legs or are unable to walk.  You notice changes in the black spots in the center of the colored part of your eye (pupil).  You have a clear or bloody fluid coming from your nose or ears.  You have a loss of vision. During the next 24 hours after the injury, you must stay with someone who can watch you for the warning signs. This person should contact local emergency services (911 in the [place] if you have seizures, you become unconscious, or you are unable to wake up. HOW CAN I PREVENT A HEAD INJURY IN THE FUTURE? The most important factor for preventing major head injuries is avoiding motor vehicle accidents. To minimize the potential for damage to your head, it is crucial to wear seat belts while riding in motor vehicles. Wearing helmets while bike riding and playing collision sports (like football) is also helpful. Also, avoiding dangerous activities around the house will further help reduce your risk of head injury.  WHEN CAN I RETURN TO NORMAL ACTIVITIES AND ATHLETICS? You should be reevaluated by your health care provider before returning to these activities. If you have any of the following symptoms, you should not return to activities or contact sports until 1 week after the symptoms have stopped:  Persistent headache.  Dizziness or vertigo.  Poor attention and concentration.  Confusion.  Memory problems.  Nausea or vomiting.  Fatigue or tire easily.  Irritability.  Intolerant of bright lights or loud noises.  Anxiety or depression.  Disturbed sleep. MAKE SURE YOU:   Understand these instructions.  Will watch your condition.  Will get help right away if you are not doing well or get worse. Document Released: 12/08/2005 Document Revised:  12/13/2013 Document Reviewed: 08/15/2013 [facility] Patient Information 2015 [place], [place]. This information is not intended to replace advice given to you by your health care provider. Make sure you discuss any questions you have with your health care provider.

## 2014-10-23 ENCOUNTER — Encounter (HOSPITAL_COMMUNITY): Payer: Self-pay

## 2015-06-01 ENCOUNTER — Emergency Department (HOSPITAL_COMMUNITY): Payer: Medicaid Other

## 2015-06-01 ENCOUNTER — Emergency Department (HOSPITAL_COMMUNITY)
Admission: EM | Admit: 2015-06-01 | Discharge: 2015-06-01 | Disposition: A | Discharge status: Home / Self Care | Payer: Medicaid Other | Attending: Emergency Medicine | Admitting: Emergency Medicine

## 2015-06-01 ENCOUNTER — Encounter (HOSPITAL_COMMUNITY): Payer: Self-pay | Admitting: Emergency Medicine

## 2015-06-01 DIAGNOSIS — Z3202 Encounter for pregnancy test, result negative: Secondary | ICD-10-CM | POA: Insufficient documentation

## 2015-06-01 DIAGNOSIS — R202 Paresthesia of skin: Secondary | ICD-10-CM | POA: Insufficient documentation

## 2015-06-01 DIAGNOSIS — R200 Anesthesia of skin: Secondary | ICD-10-CM | POA: Insufficient documentation

## 2015-06-01 DIAGNOSIS — M791 Myalgia: Secondary | ICD-10-CM | POA: Insufficient documentation

## 2015-06-01 DIAGNOSIS — Z79899 Other long term (current) drug therapy: Secondary | ICD-10-CM | POA: Insufficient documentation

## 2015-06-01 DIAGNOSIS — Z87440 Personal history of urinary (tract) infections: Secondary | ICD-10-CM | POA: Insufficient documentation

## 2015-06-01 DIAGNOSIS — R51 Headache: Secondary | ICD-10-CM | POA: Insufficient documentation

## 2015-06-01 DIAGNOSIS — Z872 Personal history of diseases of the skin and subcutaneous tissue: Secondary | ICD-10-CM | POA: Insufficient documentation

## 2015-06-01 LAB — COMPREHENSIVE METABOLIC PANEL
ALBUMIN: 3.4 g/dL — AB (ref 3.5–5.0)
ALT: 14 U/L (ref 14–54)
AST: 15 U/L (ref 15–41)
Alkaline Phosphatase: 52 U/L (ref 38–126)
Anion gap: 8 (ref 5–15)
BUN: 9 mg/dL (ref 6–20)
CO2: 24 mmol/L (ref 22–32)
Calcium: 8.5 mg/dL — ABNORMAL LOW (ref 8.9–10.3)
Chloride: 106 mmol/L (ref 101–111)
Creatinine, Ser: 0.6 mg/dL (ref 0.44–1.00)
GFR calc Af Amer: >60 mL/min (ref >60)
GFR calc non Af Amer: >60 mL/min (ref >60)
GLUCOSE: 95 mg/dL (ref 65–99)
Potassium: 4 mmol/L (ref 3.5–5.1)
SODIUM: 138 mmol/L (ref 135–145)
Total Bilirubin: 0.3 mg/dL (ref 0.3–1.2)
Total Protein: 6.4 g/dL — ABNORMAL LOW (ref 6.5–8.1)

## 2015-06-01 LAB — CBC WITH DIFFERENTIAL/PLATELET
Basophils Absolute: 0 10*3/uL (ref 0.0–0.1)
Basophils Relative: 0 % (ref 0–1)
Eosinophils Absolute: 0.2 10*3/uL (ref 0.0–0.7)
Eosinophils Relative: 2 % (ref 0–5)
HCT: 39.6 % (ref 36.0–46.0)
Hemoglobin: 13.1 g/dL (ref 12.0–15.0)
LYMPHS PCT: 32 % (ref 12–46)
Lymphs Abs: 2.5 10*3/uL (ref 0.7–4.0)
MCH: 30.2 pg (ref 26.0–34.0)
MCHC: 33.1 g/dL (ref 30.0–36.0)
MCV: 91.2 fL (ref 78.0–100.0)
MONO ABS: 0.6 10*3/uL (ref 0.1–1.0)
Monocytes Relative: 7 % (ref 3–12)
NEUTROS PCT: 59 % (ref 43–77)
Neutro Abs: 4.6 10*3/uL (ref 1.7–7.7)
Platelets: 254 10*3/uL (ref 150–400)
RBC: 4.34 MIL/uL (ref 3.87–5.11)
RDW: 12.3 % (ref 11.5–15.5)
WBC: 7.9 10*3/uL (ref 4.0–10.5)

## 2015-06-01 LAB — I-STAT BETA HCG BLOOD, ED (MC, WL, AP ONLY): I-stat hCG, quantitative: <5 m[IU]/mL (ref <5)

## 2015-06-01 LAB — CK: Total CK: 207 U/L (ref 38–234)

## 2015-06-01 MED ORDER — ACETAMINOPHEN 325 MG PO TABS
650.0000 mg | ORAL_TABLET | Freq: Once | ORAL | Status: AC
  Administered 2015-06-01: 650 mg via ORAL
  Filled 2015-06-01: qty 2

## 2015-06-01 MED ORDER — SODIUM CHLORIDE 0.9 % IV BOLUS (SEPSIS)
1000.0000 mL | INTRAVENOUS | Status: AC
  Administered 2015-06-01: 1000 mL via INTRAVENOUS

## 2015-06-01 NOTE — ED Notes (Signed)
Went to draw blood,nurse advised she was going to start an IV to wait

## 2015-06-01 NOTE — ED Provider Notes (Signed)
CSN: [id number]     Arrival date & time 06/01/15  4098 History   First MD Initiated Contact with Patient 06/01/15 [phone]     Chief Complaint  Patient presents with  . foot numbness      (Consider location/radiation/quality/duration/timing/severity/associated sxs/prior Treatment) Patient is a 34 y.o. female presenting with neurologic complaint. The history is provided by the patient.  Neurologic Problem This is a new problem. Episode onset: awoke w/ LLE numbness this morning. The problem occurs constantly. The problem has not changed since onset.Associated symptoms include headaches (intermittent x 1 week, 3/10 left frontal today). Pertinent negatives include no chest pain, no abdominal pain and no shortness of breath. The symptoms are aggravated by walking. Nothing relieves the symptoms. She has tried nothing for the symptoms. The treatment provided no relief.    Past Medical History  Diagnosis Date  . No pertinent past medical history   . Cellulitis of calf   . Headache(784.0)   . Vaginal Pap smear, abnormal   . UTI (lower urinary tract infection)    Past Surgical History  Procedure Laterality Date  . Hand surgery    . Wisdom tooth extraction    . Leep     No family history on file. History  Substance Use Topics  . Smoking status: Never Smoker   . Smokeless tobacco: Never Used  . Alcohol Use: Yes     Comment: Social, occas.   OB History    Gravida Para Term Preterm AB TAB SAB Ectopic Multiple Living   Review of Systems  Constitutional: Negative for fever and fatigue.  HENT: Negative for congestion and drooling.   Eyes: Negative for pain.  Respiratory: Negative for cough and shortness of breath.   Cardiovascular: Negative for chest pain.  Gastrointestinal: Negative for nausea, vomiting, abdominal pain and diarrhea.  Genitourinary: Negative for dysuria and hematuria.  Musculoskeletal: Positive for myalgias. Negative for back pain, gait problem and neck  pain.  Skin: Negative for color change.  Neurological: Positive for headaches (intermittent x 1 week, 3/10 left frontal today). Negative for dizziness.  Hematological: Negative for adenopathy.  Psychiatric/Behavioral: Negative for behavioral problems.  All other systems reviewed and are negative.     Allergies  Codeine and Mushroom extract complex  Home Medications   Prior to Admission medications   Medication Sig Start Date End Date Taking? Authorizing Provider  bismuth subsalicylate (PEPTO BISMOL) 262 MG/15ML suspension Take 30 mLs by mouth every 6 (six) hours as needed for indigestion.    Historical Provider, MD  Cholecalciferol (VITAMIN D-3) 5000 UNITS TABS Take 10,000 Units by mouth daily.    Historical Provider, MD  ibuprofen (ADVIL,MOTRIN) 800 MG tablet Take 400 mg by mouth every 8 (eight) hours as needed for headache.    Historical Provider, MD  levonorgestrel (MIRENA) 20 MCG/24HR IUD 1 each by Intrauterine route once.      Historical Provider, MD  Multiple Vitamin (MULTIVITAMIN WITH MINERALS) TABS tablet Take 1 tablet by mouth daily.    Historical Provider, MD   BP 113/57 mmHg  Pulse 76  Temp(Src) 98.3 F (36.8 C) (Oral)  Resp 16  SpO2 100% Physical Exam  Constitutional: She is oriented to person, place, and time. She appears well-developed and well-nourished.  HENT:  Head: Normocephalic.  Mouth/Throat: Oropharynx is clear and moist. No oropharyngeal exudate.  Eyes: Conjunctivae and EOM are normal. Pupils are equal, round, and reactive to light.  Neck: Normal range of motion. Neck supple.  Cardiovascular: Normal rate, regular rhythm, normal heart sounds and intact distal pulses.  Exam reveals no gallop and no friction rub.   No murmur heard. Pulmonary/Chest: Effort normal and breath sounds normal. No respiratory distress. She has no wheezes.  Abdominal: Soft. Bowel sounds are normal. There is no tenderness. There is no rebound and no guarding.  Musculoskeletal: Normal  range of motion. She exhibits no edema or tenderness.  44 cm circumference of the left calf. 45 cm circumference of the right calf.  No focal tenderness of the lower extremities.  She does appear to have some pain with dorsiflexion of the left foot.  Grossly symmetric lower extremities.  Mild crepitus of the left knee with range of motion.  2+ distal pulses in bilateral lower extremities.  Neurological: She is alert and oriented to person, place, and time.  Reflex Scores:      Patellar reflexes are 2+ on the right side and 2+ on the left side.      Achilles reflexes are 2+ on the right side and 2+ on the left side. alert, oriented x3 speech: normal in context and clarity memory: intact grossly cranial nerves II-XII: intact motor strength: full proximally and distally no involuntary movements or tremors sensation: Altered sensation to light touch in the left lower extremity, otherwise intact to light touch diffusely  cerebellar: finger-to-nose and heel-to-shin intact gait: normal forwards and backwards  Skin: Skin is warm and dry.  Psychiatric: She has a normal mood and affect. Her behavior is normal.  Nursing note and vitals reviewed.   ED Course  Procedures (including critical care time) Labs Review Labs Reviewed  COMPREHENSIVE METABOLIC PANEL - Abnormal; Notable for the following:    Calcium 8.5 (*)    Total Protein 6.4 (*)    Albumin 3.4 (*)    All other components within normal limits  CBC WITH DIFFERENTIAL/PLATELET  CK  I-STAT BETA HCG BLOOD, ED ([facility], [facility], [facility] ONLY)    Imaging Review Ct Head Wo Contrast  06/01/2015   CLINICAL DATA:  Left lower extremity numbness since yesterday.  EXAM: CT HEAD WITHOUT CONTRAST  TECHNIQUE: Contiguous axial images were obtained from the base of the skull through the vertex without intravenous contrast.  COMPARISON:  05/28/2004  FINDINGS: The ventricles are normal in size and configuration. No extra-axial fluid collections are identified.  The gray-white differentiation is normal. No CT findings for acute intracranial process such as hemorrhage or infarction. No mass lesions. The brainstem and cerebellum are grossly normal.  The bony structures are intact. The paranasal sinuses and mastoid air cells are clear. The globes are intact.  IMPRESSION: Normal head CT.   Electronically Signed   By: Rudie Meyer M.D.   On: 06/01/2015 09:07   Dg Knee Complete 4 Views Left  06/01/2015   CLINICAL DATA:  New LEFT knee pain and grinding sensation while working out on 05/30/2015, some soft tissue swelling and increased pain since, LEFT knee crepitus  EXAM: LEFT KNEE - COMPLETE 4+ VIEW  COMPARISON:  None  FINDINGS: Mild medial compartment joint space narrowing.  Osseous mineralization grossly normal for technique.  Remaining joint spaces preserved.  No acute fracture, dislocation or bone destruction.  No knee joint effusion.  IMPRESSION: Minimal degenerative changes medial compartment LEFT knee.   Electronically Signed   By: Ulyses Southward M.D.   On: 06/01/2015 08:59     EKG Interpretation   Date/Time:  Friday June 01 2015 08:23:44  EDT Ventricular Rate:  69 PR Interval:  181 QRS Duration: 90 QT Interval:  398 QTC Calculation: 426 R Axis:   63 Text Interpretation:  Sinus rhythm ST elev, probable normal early repol  pattern Confirmed by Salvatore Poe  MD, Delphine Sizemore ([id number]) on 06/01/2015 8:31:41 AM      MDM   Final diagnoses:  Numbness and tingling of left leg    8:11 AM 34 y.o. female who presents with multiple complaints. She states that she has been having left leg pain for about one week. Seems to be worse at the left knee and with ambulation. She notes she just started working out about one month ago and the intensity has been increasing lately. She states that she woke up with some numbness and tingling in the toes of her left lower extremity today. She also feels some altered sensation to light touch in the entire left lower extremity. Neurologic  exam is otherwise unremarkable. Low risk wells for DVT. She has no stroke risk factors. Vital signs unremarkable here. She does use the Mirena IUD. Also having intermittent headaches over the last week. Currently has a mild, 3 out of 10 left frontal headache. She denies any fevers or head injuries.  9:54 AM: I interpreted/reviewed the labs and/or imaging which were non-contributory.  I discussed the limitations of CT and ruling out stroke with the patient. I have a low suspicion for stroke based on her exam and lack of risk factors. I think her symptoms are likely related to overexertion given recent increase in intensity of exercise as well as being on her feet all day at work. We'll have her monitor her symptoms for any worsening. I have discussed the diagnosis/risks/treatment options with the patient and believe the pt to be eligible for discharge home to follow-up with a pcp. We also discussed returning to the ED immediately if new or worsening sx occur. We discussed the sx which are most concerning (e.g., weakness, worsening numbness/tingling, worsening pain) that necessitate immediate return. Medications administered to the patient during their visit and any new prescriptions provided to the patient are listed below.  Medications given during this visit Medications  sodium chloride 0.9 % bolus 1,000 mL (1,000 mLs Intravenous New Bag/Given 06/01/15 0827)  acetaminophen (TYLENOL) tablet 650 mg (650 mg Oral Given 06/01/15 0827)    New Prescriptions   No medications on file     Purvis Sheffield, MD 06/01/15 [phone]

## 2015-06-01 NOTE — ED Notes (Signed)
Per pt, states she has been having headaches on and off for about a week-states was at work and felt her left foot and toes going numb

## 2015-06-01 NOTE — ED Notes (Addendum)
Pt present complaint of left leg/foot numbness and left foot pain with flexion. Pt continues to report having "grinding" sound and pain to left knee Wednesday with personal trainer; pt reports since has had worsening pain and numbness to knee radiating to foot.

## 2015-06-01 NOTE — ED Notes (Signed)
Harrison at bedside. 

## 2015-06-01 NOTE — Discharge Instructions (Signed)
°Emergency Department Resource Guide °1) Find a Doctor and Pay Out of Pocket °Although you won't have to find out who is covered by your insurance plan, it is a good idea to ask around and get recommendations. You will then need to call the office and see if the doctor you have chosen will accept you as a new patient and what types of options they offer for patients who are self-pay. Some doctors offer discounts or will set up payment plans for their patients who do not have insurance, but you will need to ask so you aren't surprised when you get to your appointment. ° °2) Contact Your Local Health Department °Not all health departments have doctors that can see patients for sick visits, but many do, so it is worth a call to see if yours does. If you don't know where your local health department is, you can check in your phone book. The CDC also has a tool to help you locate your state's health department, and many state websites also have listings of all of their local health departments. ° °3) Find a Walk-in Clinic °If your illness is not likely to be very severe or complicated, you may want to try a walk in clinic. These are popping up all over the country in pharmacies, drugstores, and shopping centers. They're usually staffed by nurse practitioners or physician assistants that have been trained to treat common illnesses and complaints. They're usually fairly quick and inexpensive. However, if you have serious medical issues or chronic medical problems, these are probably not your best option. ° °No Primary Care Doctor: °- Call [facility] at  832-8000 - they can help you locate a primary care doctor that  accepts your insurance, provides certain services, etc. °- Physician Referral Service- 1-800-533-3463 ° °Chronic Pain Problems: °Organization         Address  Phone   Notes  °[facility]  (336) 297-2271 Patients need to be referred by their primary care doctor.  ° °Medication  Assistance: °Organization         Address  Phone   Notes  °[place] [facility] [address] °[place], [place] [address] (336) 641-8030 --Must be a resident of [place] °-- Must have NO insurance coverage whatsoever (no Medicaid/ Medicare, etc.) °-- The pt. MUST have a primary care doctor that directs their care regularly and follows them in the community °  °[facility]  (866) 331-1348   °[employer]  (888) 892-1162   ° °Agencies that provide inexpensive medical care: °Organization         Address  Phone   Notes  °[facility]  (336) 832-8035   °[facility]    (336) 832-7272   °[facility] [address] °[place], [place] [address] (336) 832-4777   °[facility] [address], °[place] (336) 271-4999   °[facility]    (336) 373-0678   °[facility]    (336) 272-1050   °[facility] ° [address], [place] Phone:  (336) 832-4444, Fax:  (336) 832-4440 Hours of Operation:  9 am - 6 pm, M-F.  Also accepts Medicaid/Medicare and self-pay.  °[facility] ° [address],  Phone: (336) 832-3150, Fax: (336) 832-3151. Hours of Operation:  8:30 am - 5:30 pm, M-F.  Also accepts Medicaid and self-pay.  °[facility] [place] [address], [place] Phone: (336) 878-6027   °[facility] [address], [place], [place] (336)723-1848, Ext. 123 Mondays & Thursdays: 7-9 AM.  First 15 patients are seen on a first come, first serve basis. °  ° °Medicaid-accepting [place] Providers: ° °Organization         Address  Phone   Notes  °[facility] [address], [place] (336) 641-2100 Also accepts self-pay patients.  °[facility] [address], [place] ° (336) 856-9996   °[facility] [address], [place]  (336) 288-8857   °[facility] [address] [place] [address], [place] (336) 299-7000   °Veita Bland [address], [place]  ° (336) 373-1557 Only accepts [place] Access Medicaid patients after they have their name applied to their card.  ° °Self-Pay (no insurance) in [place]: ° °Organization         Address  Phone   Notes  °Sickle Cell Patients, [facility] [address], [place] (336) 832-1970   °[facility] [address], [place] (336) 832-4400   °[facility] [place] ° [address], [place] (336) 992-4800   °[facility]/Dr. Osei-Bonsu ° [address] [place] [address], [place] or [address], [place] (336) 841-8500 Phone number for both [place] and [place] locations is the same.  °[facility] [address], [place] (336) 299-0000   °[facility] [address] [place] [address], [place] or [address] (336) 852-7530 °(336) 878-2260   °[facility] [address], [place] (336) 350-1642, phone; (336) 294-5005, fax Sees patients 1st and 3rd Saturday of every month.  Must not qualify for public or private insurance (i.e. Medicaid, Medicare, [place] Health Choice, Veterans' Benefits) • Household income should be no more than 200% of the poverty level •The clinic cannot treat you if you are pregnant or think you are pregnant • Sexually transmitted diseases are not treated at the clinic.  ° ° °Dental Care: °Organization         Address  Phone  Notes  °[place] [facility] [address], [place] (336) 641-6152 Accepts children up to age 21 who are enrolled in Medicaid or [place] Health Choice; pregnant women with a Medicaid card; and children who have applied for Medicaid or [place] Health Choice, but were declined, whose parents can pay a reduced fee at time of service.  °[place]  [facility] [place]  [address], [place] (336) 641-7733 Accepts children up to age 21 who are enrolled in Medicaid or [place] Health Choice; pregnant women with a Medicaid card; and children who have applied for Medicaid or [place] Health Choice, but were declined, whose parents can pay a reduced fee at time of service.  °[facility] ° [address],  (336) 641-4533 Patients are seen by appointment only. Walk-ins are not accepted. [facility] will see patients 18 years of age and older. °Monday - Tuesday (8am-5pm) °Most Wednesdays (8:30-5pm) °$30 per visit, cash only  °[facility] ° [address], [place] (336) 641-4533 Patients are seen by appointment only. Walk-ins are not accepted. [facility] will see patients 18 years of age and older. °One   Wednesday Evening (Monthly: Volunteer Based).  $30 per visit, cash only  °[facility]  (919) 537-3737 for adults; Children under age 4, call Graduate Pediatric Dentistry at ([phone]) [phone]. Children aged 4-14, please call (919) [phone] to request a pediatric application. ° Dental services are provided in all areas of dental care including fillings, crowns and bridges, complete and partial dentures, implants, gum treatment, root canals, and extractions. Preventive care is also provided. Treatment is provided to both adults and children. °Patients are selected via a lottery and there is often a waiting list. °  °[facility] [address], °[place] ° (336) 763-8833 [email or web address] °  °[facility] [address], [place], [place] (336)723-1848, Ext. 123 Second and Fourth Thursday of each month, opens at 6:30 AM; Clinic ends at 9 AM.  Patients are seen on a first-come first-served basis, and a limited number are seen during each clinic.  ° °[facility] ° [address], [place], [place] (336) 723-7904    Eligibility Requirements °You must have lived in [place], [place], or [place] counties for at least the last three months. °  You cannot be eligible for state or federal sponsored healthcare insurance, including Veterans Administration, Medicaid, or Medicare. °  You generally cannot be eligible for healthcare insurance through your employer.  °  How to apply: °Eligibility screenings are held every Tuesday and Wednesday afternoon from 1:00 pm until 4:00 pm. You do not need an appointment for the interview!  °[facility] [address], [place], [place] 336-631-2330   °[facility] [facility]  336-342-8273   °[facility]  336-703-3100   °[facility]  336-570-6415   ° °Behavioral Health Resources in the Community: °Intensive Outpatient Programs °Organization         Address  Phone  Notes  °[facility] [address], [place], [place] 336-878-6098   °[facility] [address], [place], [place] 336-832-9800   °[facility] [address], [place], [place] ° 336-882-2125   °[facility] [address],  °[place], [place] 1-800-853-5163 or 336-641-4981   °Substance Abuse Resources °Organization         Address  Phone  Notes  °[facility]  336-882-2125   °[facility]  336-784-9470   °[facility]  336-285-9073   °[facility]  336-845-3988   °Residential & Outpatient Substance Abuse Program  1-800-659-3381   °Psychological Services °Organization         Address  Phone  Notes  °[facility]  336- 832-9600   °[facility]  336- 378-7881   °[facility] [address], [place] 1-800-853-5163 or 336-641-4981   ° °Mobile Crisis Teams °Organization         Address  Phone  Notes  °[facility], Mobile Crisis Care Unit  1-877-626-1772   °Assertive °[facility] ° [address]  [place], [place] 336-834-9664   °Sharon DeEsch [address] °[place] 336-554-5454   ° °Self-Help/Support Groups °Organization         Address  Phone             Notes  °[facility] [place] - variety of support groups  336- 373-1402 Call for more information  °Narcotics Anonymous (NA), [facility] [address], °[place]  2 meetings at this location  ° °  Residential Treatment Programs °Organization         Address  Phone  Notes  °[facility] [address],    °[place]  1-866-801-8205   °[facility] ° [address], [place], [place] 704-293-8524   °[facility] [address], [place] 336-845-3988 Admissions: 8am-3pm M-F  °[facility] [address]    °[place], [place] 336-841-1104   °[facility] [address], [place], [place] 336-379-7146   °[facility] [address]  °[place], [place] 336-285-9073   °[facility] - Intensive Outpatient [address], [place], [place] 336-852-3033   °[facility] [address]  °[place], [place] 1-877-615-2722 or 336-784-9470   °[facility] [address] [place], [place] 336-227-7417 Accepts Medicaid  °[facility] [address]  ° [place] 1-800-659-3381 Substance Abuse/Addiction Treatment  ° °[place] Behavioral Health Resources °Organization         Address  Phone  Notes  °[facility]  (888) 581-9988   °Julie Brannon, PhD [address] [place], [place]   (336) 349-5553 or (336) 951-0000   °[facility]   [address] °[place], [place] (336) 349-4454   °[facility] [address], [place], [place] (336) 342-8316 Insurance/Medicaid/sponsorship through [facility]  °[facility] [address]                                    [place], [place] (336) 342-8316 Therapy/tele-psych/case    °[facility] [address]  ° [place], [place] (336) 349-2233    °Dr. Arfeen  (336) 349-4544   °[facility] [place]  [employer] [place] [facility] 1) [address], [place] °2) [address], [place] °3)  [address], [place] (336) 349-3220 °(336) 342-7768 ° °(336) 342-8140   °[place] Child Abuse Hotline (336) 342-1394 or (336) 342-3537 (After Hours)    ° ° °

## 2015-10-06 ENCOUNTER — Encounter (HOSPITAL_COMMUNITY): Payer: Self-pay

## 2015-10-06 ENCOUNTER — Inpatient Hospital Stay (HOSPITAL_COMMUNITY)
Admission: AD | Admit: 2015-10-06 | Discharge: 2015-10-06 | Disposition: A | Discharge status: Home / Self Care | Payer: Medicaid Other | Source: Ambulatory Visit | Attending: Obstetrics & Gynecology | Admitting: Obstetrics & Gynecology

## 2015-10-06 DIAGNOSIS — N923 Ovulation bleeding: Secondary | ICD-10-CM

## 2015-10-06 DIAGNOSIS — Z30432 Encounter for removal of intrauterine contraceptive device: Secondary | ICD-10-CM | POA: Insufficient documentation

## 2015-10-06 DIAGNOSIS — R1030 Lower abdominal pain, unspecified: Secondary | ICD-10-CM | POA: Insufficient documentation

## 2015-10-06 DIAGNOSIS — N939 Abnormal uterine and vaginal bleeding, unspecified: Secondary | ICD-10-CM

## 2015-10-06 HISTORY — DX: Localized edema: R60.0

## 2015-10-06 LAB — POCT PREGNANCY, URINE: Preg Test, Ur: NEGATIVE

## 2015-10-06 LAB — URINALYSIS, ROUTINE W REFLEX MICROSCOPIC
BILIRUBIN URINE: NEGATIVE
Glucose, UA: NEGATIVE mg/dL
HGB URINE DIPSTICK: NEGATIVE
KETONES UR: NEGATIVE mg/dL
Leukocytes, UA: NEGATIVE
NITRITE: NEGATIVE
PH: 6.5 (ref 5.0–8.0)
Protein, ur: NEGATIVE mg/dL
Specific Gravity, Urine: 1.02 (ref 1.005–1.030)
Urobilinogen, UA: 0.2 mg/dL (ref 0.0–1.0)

## 2015-10-06 MED ORDER — IBUPROFEN 800 MG PO TABS: 800.0000 mg | ORAL_TABLET | Freq: Three times a day (TID) | ORAL | Status: DC | PRN

## 2015-10-06 NOTE — MAU Provider Note (Signed)
History     CSN: [id number]  Arrival date and time: 10/06/15 0917   First Provider Initiated Contact with Patient 10/06/15 [id number]      Chief Complaint  Patient presents with  . Abdominal Pain  . Vaginal Bleeding   HPI   Cindy [name] is a 34 y.o. female [phone] presenting with abnormal vaginal bleeding and lower abdominal pain; she has an expired IUD in place.  It has been 5 years since her IUD was placed and she wants it out. [facility]; patient is no longer a patient there due to insurance  On Sept 29 she had her menstrual cycle. She has had bleeding after intercourse very frequent since this menstrual cycle.    Yesterday she had a lot of bleeding following intercourse; enough that she had to wear a pad.   She has had bilateral lower abdominal pain; + cramping pain. She took ibuprofen last night 800 mg which helped some. She currently rates her pain 5/10. She denies anything for pain at this time.   OB History    Gravida Para Term Preterm AB TAB SAB Ectopic Multiple Living   2 2 2       2       Past Medical History  Diagnosis Date  . No pertinent past medical history   . Cellulitis of calf   . Headache(784.0)   . Vaginal Pap smear, abnormal   . UTI (lower urinary tract infection)   . Edema of left foot     onset 11 years    Past Surgical History  Procedure Laterality Date  . Hand surgery    . Wisdom tooth extraction    . Leep      No family history on file.  Social History  Substance Use Topics  . Smoking status: Never Smoker   . Smokeless tobacco: Never Used  . Alcohol Use: Yes     Comment: Social, occas.    Allergies:  Allergies  Allergen Reactions  . Codeine Other (See Comments)    bad headache  . Mushroom Extract Complex Other (See Comments)    Itching of tongue and eyes    Prescriptions prior to admission  Medication Sig Dispense Refill Last Dose  . ibuprofen (ADVIL,MOTRIN) 100 MG chewable tablet Chew 300 mg  by mouth every 8 (eight) hours as needed (For headache.).   Past Week  . levonorgestrel (MIRENA) 20 MCG/24HR IUD 1 each by Intrauterine route once.     06/01/2015  . Multiple Vitamin (MULTIVITAMIN WITH MINERALS) TABS tablet Take 1 tablet by mouth daily.   06/01/2015   Results for orders placed or performed during the hospital encounter of 10/06/15 (from the past 24 hour(s))  Urinalysis, Routine w reflex microscopic (not at [facility])     Status: None   Collection Time: 10/06/15  9:25 AM  Result Value Ref Range   Color, Urine YELLOW YELLOW   APPearance CLEAR CLEAR   Specific Gravity, Urine 1.020 1.005 - 1.030   pH 6.5 5.0 - 8.0   Glucose, UA NEGATIVE NEGATIVE mg/dL   Hgb urine dipstick NEGATIVE NEGATIVE   Bilirubin Urine NEGATIVE NEGATIVE   Ketones, ur NEGATIVE NEGATIVE mg/dL   Protein, ur NEGATIVE NEGATIVE mg/dL   Urobilinogen, UA 0.2 0.0 - 1.0 mg/dL   Nitrite NEGATIVE NEGATIVE   Leukocytes, UA NEGATIVE NEGATIVE  Pregnancy, urine POC     Status: None   Collection Time: 10/06/15  9:28 AM  Result Value Ref Range  Preg Test, Ur NEGATIVE NEGATIVE    Review of Systems  Constitutional: Negative for malaise/fatigue.  Gastrointestinal: Negative for abdominal pain.  Neurological: Negative for dizziness.   Physical Exam   Blood pressure 124/65, pulse 79, temperature 98.2 F (36.8 C), temperature source Oral, resp. rate 16, height 5' 8.25" (1.734 m), weight 255 lb 12.8 oz (116.03 kg), last menstrual period 09/20/2015.  Physical Exam  Constitutional: She is oriented to person, place, and time. She appears well-developed and well-nourished. No distress.  HENT:  Head: Normocephalic.  Eyes: Pupils are equal, round, and reactive to light.  Neck: Neck supple.  GI: Soft.  Genitourinary:  Speculum exam: Vagina - Small amount of creamy, pale yellow discharge, no odor IUD strings visualized; IUD removed without difficulty using ring forceps, intact. No bleeding noted post removal.  Chaperone  present for exam.  Musculoskeletal: Normal range of motion.  Neurological: She is alert and oriented to person, place, and time.  Skin: Skin is warm. She is not diaphoretic.  Psychiatric: Her behavior is normal.    MAU Course  Procedures  None  MDM Patient declined STI testing at this time.   Assessment and Plan   A:  1. Spotting between menses   2. Encounter for IUD removal    P:  Discharge home in stable condition Condoms [facility] RX: Ibuprofen Return to MAU if symptoms worsen.    Duane Lope, NP 10/06/2015 9:43 AM

## 2015-10-06 NOTE — MAU Note (Signed)
Had regular cycle started 09/20/15 having abdominal pain mid lower, painful intercourse, started having light colored bleeding heavy in nature.

## 2015-10-06 NOTE — Discharge Instructions (Signed)
Contraception Choices Contraception (birth control) is the use of any methods or devices to prevent pregnancy. Below are some methods to help avoid pregnancy. HORMONAL METHODS   Contraceptive implant. This is a thin, plastic tube containing progesterone hormone. It does not contain estrogen hormone. Your health care provider inserts the tube in the inner part of the upper arm. The tube can remain in place for up to 3 years. After 3 years, the implant must be removed. The implant prevents the ovaries from releasing an egg (ovulation), thickens the cervical mucus to prevent sperm from entering the uterus, and thins the lining of the inside of the uterus.  Progesterone-only injections. These injections are given every 3 months by your health care provider to prevent pregnancy. This synthetic progesterone hormone stops the ovaries from releasing eggs. It also thickens cervical mucus and changes the uterine lining. This makes it harder for sperm to survive in the uterus.  Birth control pills. These pills contain estrogen and progesterone hormone. They work by preventing the ovaries from releasing eggs (ovulation). They also cause the cervical mucus to thicken, preventing the sperm from entering the uterus. Birth control pills are prescribed by a health care provider.Birth control pills can also be used to treat heavy periods.  Minipill. This type of birth control pill contains only the progesterone hormone. They are taken every day of each month and must be prescribed by your health care provider.  Birth control patch. The patch contains hormones similar to those in birth control pills. It must be changed once a week and is prescribed by a health care provider.  Vaginal ring. The ring contains hormones similar to those in birth control pills. It is left in the vagina for 3 weeks, removed for 1 week, and then a new one is put back in place. The patient must be comfortable inserting and removing the ring  from the vagina.A health care provider's prescription is necessary.  Emergency contraception. Emergency contraceptives prevent pregnancy after unprotected sexual intercourse. This pill can be taken right after sex or up to 5 days after unprotected sex. It is most effective the sooner you take the pills after having sexual intercourse. Most emergency contraceptive pills are available without a prescription. Check with your pharmacist. Do not use emergency contraception as your only form of birth control. BARRIER METHODS   Female condom. This is a thin sheath (latex or rubber) that is worn over the penis during sexual intercourse. It can be used with spermicide to increase effectiveness.  Female condom. This is a soft, loose-fitting sheath that is put into the vagina before sexual intercourse.  Diaphragm. This is a soft, latex, dome-shaped barrier that must be fitted by a health care provider. It is inserted into the vagina, along with a spermicidal jelly. It is inserted before intercourse. The diaphragm should be left in the vagina for 6 to 8 hours after intercourse.  Cervical cap. This is a round, soft, latex or plastic cup that fits over the cervix and must be fitted by a health care provider. The cap can be left in place for up to 48 hours after intercourse.  Sponge. This is a soft, circular piece of polyurethane foam. The sponge has spermicide in it. It is inserted into the vagina after wetting it and before sexual intercourse.  Spermicides. These are chemicals that kill or block sperm from entering the cervix and uterus. They come in the form of creams, jellies, suppositories, foam, or tablets. They do not require a   prescription. They are inserted into the vagina with an applicator before having sexual intercourse. The process must be repeated every time you have sexual intercourse. INTRAUTERINE CONTRACEPTION  Intrauterine device (IUD). This is a T-shaped device that is put in a woman's uterus  during a menstrual period to prevent pregnancy. There are 2 types:  Copper IUD. This type of IUD is wrapped in copper wire and is placed inside the uterus. Copper makes the uterus and fallopian tubes produce a fluid that kills sperm. It can stay in place for 10 years.  Hormone IUD. This type of IUD contains the hormone progestin (synthetic progesterone). The hormone thickens the cervical mucus and prevents sperm from entering the uterus, and it also thins the uterine lining to prevent implantation of a fertilized egg. The hormone can weaken or kill the sperm that get into the uterus. It can stay in place for 3-5 years, depending on which type of IUD is used. PERMANENT METHODS OF CONTRACEPTION  Female tubal ligation. This is when the woman's fallopian tubes are surgically sealed, tied, or blocked to prevent the egg from traveling to the uterus.  Hysteroscopic sterilization. This involves placing a small coil or insert into each fallopian tube. Your doctor uses a technique called hysteroscopy to do the procedure. The device causes scar tissue to form. This results in permanent blockage of the fallopian tubes, so the sperm cannot fertilize the egg. It takes about 3 months after the procedure for the tubes to become blocked. You must use another form of birth control for these 3 months.  Female sterilization. This is when the female has the tubes that carry sperm tied off (vasectomy).This blocks sperm from entering the vagina during sexual intercourse. After the procedure, the man can still ejaculate fluid (semen). NATURAL PLANNING METHODS  Natural family planning. This is not having sexual intercourse or using a barrier method (condom, diaphragm, cervical cap) on days the woman could become pregnant.  Calendar method. This is keeping track of the length of each menstrual cycle and identifying when you are fertile.  Ovulation method. This is avoiding sexual intercourse during ovulation.  Symptothermal  method. This is avoiding sexual intercourse during ovulation, using a thermometer and ovulation symptoms.  Post-ovulation method. This is timing sexual intercourse after you have ovulated. Regardless of which type or method of contraception you choose, it is important that you use condoms to protect against the transmission of sexually transmitted infections (STIs). Talk with your health care provider about which form of contraception is most appropriate for you.   This information is not intended to replace advice given to you by your health care provider. Make sure you discuss any questions you have with your health care provider.   Document Released: 12/08/2005 Document Revised: 12/13/2013 Document Reviewed: 06/02/2013 Elsevier Interactive Patient Education 2016 Elsevier Inc.  

## 2015-10-29 ENCOUNTER — Emergency Department (HOSPITAL_COMMUNITY)
Admission: EM | Admit: 2015-10-29 | Discharge: 2015-10-30 | Disposition: A | Discharge status: Home / Self Care | Payer: Medicaid Other | Attending: Emergency Medicine | Admitting: Emergency Medicine

## 2015-10-29 DIAGNOSIS — Z79899 Other long term (current) drug therapy: Secondary | ICD-10-CM | POA: Insufficient documentation

## 2015-10-29 DIAGNOSIS — Y93E1 Activity, personal bathing and showering: Secondary | ICD-10-CM | POA: Insufficient documentation

## 2015-10-29 DIAGNOSIS — Z87440 Personal history of urinary (tract) infections: Secondary | ICD-10-CM | POA: Diagnosis not present

## 2015-10-29 DIAGNOSIS — W19XXXA Unspecified fall, initial encounter: Secondary | ICD-10-CM

## 2015-10-29 DIAGNOSIS — S0993XA Unspecified injury of face, initial encounter: Secondary | ICD-10-CM | POA: Diagnosis present

## 2015-10-29 DIAGNOSIS — W01198A Fall on same level from slipping, tripping and stumbling with subsequent striking against other object, initial encounter: Secondary | ICD-10-CM | POA: Insufficient documentation

## 2015-10-29 DIAGNOSIS — Y998 Other external cause status: Secondary | ICD-10-CM | POA: Insufficient documentation

## 2015-10-29 DIAGNOSIS — Y92002 Bathroom of unspecified non-institutional (private) residence single-family (private) house as the place of occurrence of the external cause: Secondary | ICD-10-CM | POA: Diagnosis not present

## 2015-10-29 DIAGNOSIS — Z872 Personal history of diseases of the skin and subcutaneous tissue: Secondary | ICD-10-CM | POA: Diagnosis not present

## 2015-10-29 NOTE — ED Notes (Signed)
Pt states that she fell approx. 35 mins ago in the shower and hit her mouth on the side of the bath tub. States that she now feels like her two front teeth are wiggling and out of place. Alert and oriented.

## 2015-10-30 NOTE — Discharge Instructions (Signed)
Follow up with the recommended dentist. Refer to attached documents for more information.

## 2015-10-30 NOTE — ED Notes (Signed)
Bed: WA03 Expected date:  Expected time:  Means of arrival:  Comments: 

## 2015-10-30 NOTE — ED Provider Notes (Signed)
CSN: [id number]     Arrival date & time 10/29/15  2339 History   First MD Initiated Contact with Patient 10/30/15 0259     Chief Complaint  Patient presents with  . Dental Injury     (Consider location/radiation/quality/duration/timing/severity/associated sxs/prior Treatment) HPI Comments: Patient reports falling in the shower and hitting her front teeth on the wall. Since then, she has dull pain to the right front tooth without radiation.   Patient is a 34 y.o. female presenting with dental injury. The history is provided by the patient. No language interpreter was used.  Dental Injury This is a new problem. The current episode started today. The problem occurs constantly. The problem has been unchanged. Pertinent negatives include no abdominal pain, arthralgias, change in bowel habit, chills, congestion, coughing, fatigue, headaches, joint swelling, myalgias, rash, vertigo, visual change or vomiting. Nothing aggravates the symptoms. She has tried nothing for the symptoms. The treatment provided no relief.    Past Medical History  Diagnosis Date  . No pertinent past medical history   . Cellulitis of calf   . Headache(784.0)   . Vaginal Pap smear, abnormal   . UTI (lower urinary tract infection)   . Edema of left foot     onset 11 years   Past Surgical History  Procedure Laterality Date  . Hand surgery    . Wisdom tooth extraction    . Leep     No family history on file. Social History  Substance Use Topics  . Smoking status: Never Smoker   . Smokeless tobacco: Never Used  . Alcohol Use: Yes     Comment: Social, occas.   OB History    Gravida Para Term Preterm AB TAB SAB Ectopic Multiple Living   2 2 2       2      Review of Systems  Constitutional: Negative for chills and fatigue.  HENT: Positive for dental problem. Negative for congestion.   Respiratory: Negative for cough.   Gastrointestinal: Negative for vomiting, abdominal pain and change in bowel habit.   Musculoskeletal: Negative for myalgias, joint swelling and arthralgias.  Skin: Negative for rash.  Neurological: Negative for vertigo and headaches.  All other systems reviewed and are negative.     Allergies  Codeine and Mushroom extract complex  Home Medications   Prior to Admission medications   Medication Sig Start Date End Date Taking? Authorizing Provider  furosemide (LASIX) 40 MG tablet Take 1 tablet by mouth daily as needed for fluid.  10/09/15  Yes Historical Provider, MD  ibuprofen (ADVIL,MOTRIN) 800 MG tablet Take 1 tablet (800 mg total) by mouth every 8 (eight) hours as needed. 10/06/15  Yes Duane LopeJennifer I Rasch, NP  Multiple Vitamin (MULTIVITAMIN WITH MINERALS) TABS tablet Take 1 tablet by mouth daily.   Yes Historical Provider, MD  potassium chloride (K-DUR) 10 MEQ tablet Take 1 tablet by mouth daily. 10/28/15  Yes Historical Provider, MD   BP 118/68 mmHg  Pulse 78  Temp(Src) 97.8 F (36.6 C) (Oral)  Resp 18  SpO2 100%  LMP 10/19/2015 Physical Exam  Constitutional: She is oriented to person, place, and time. She appears well-developed and well-nourished. No distress.  HENT:  Head: Normocephalic and atraumatic.  Right front tooth slightly loose. No dental fracture noted.   Eyes: Conjunctivae and EOM are normal.  Neck: Normal range of motion.  Cardiovascular: Normal rate and regular rhythm.  Exam reveals no gallop and no friction rub.   No murmur heard. Pulmonary/Chest: Effort  normal and breath sounds normal. She has no wheezes. She has no rales. She exhibits no tenderness.  Abdominal: Soft. She exhibits no distension. There is no tenderness. There is no rebound.  Musculoskeletal: Normal range of motion.  Neurological: She is alert and oriented to person, place, and time. Coordination normal.  Speech is goal-oriented. Moves limbs without ataxia.   Skin: Skin is warm and dry.  Psychiatric: She has a normal mood and affect. Her behavior is normal.  Nursing note and  vitals reviewed.   ED Course  Procedures (including critical care time) Labs Review Labs Reviewed - No data to display  Imaging Review No results found. I have personally reviewed and evaluated these images and lab results as part of my medical decision-making.   EKG Interpretation None      MDM   Final diagnoses:  Fall, initial encounter  Dental injury, initial encounter    3:37 AM Patient has some discomfort after falling. No obvious injury or dental fracture. Patient will be referred to on call dentist.     Emilia Beck, PA-C 10/30/15 [id number]  Gilda Crease, MD 10/30/15 [phone]

## 2015-10-30 NOTE — ED Notes (Signed)
PA at bedside.

## 2015-11-28 ENCOUNTER — Ambulatory Visit (INDEPENDENT_AMBULATORY_CARE_PROVIDER_SITE_OTHER): Payer: Self-pay | Admitting: Internal Medicine

## 2015-11-28 ENCOUNTER — Encounter: Payer: Self-pay | Admitting: Internal Medicine

## 2015-11-28 VITALS — BP 128/76 | HR 80 | Temp 98.4°F | Resp 20 | Ht 68.0 in | Wt 246.0 lb

## 2015-11-28 DIAGNOSIS — A599 Trichomoniasis, unspecified: Secondary | ICD-10-CM

## 2015-11-28 DIAGNOSIS — R300 Dysuria: Secondary | ICD-10-CM

## 2015-11-28 LAB — POCT URINALYSIS DIPSTICK
Bilirubin, UA: NEGATIVE
Glucose, UA: NEGATIVE
Ketones, UA: NEGATIVE
NITRITE UA: NEGATIVE
PH UA: 7.5
Protein, UA: 15
RBC UA: NEGATIVE
Spec Grav, UA: 1.01
UROBILINOGEN UA: 4

## 2015-11-28 LAB — POCT WET PREP WITH KOH
Clue Cells Wet Prep HPF POC: NEGATIVE
KOH PREP POC: NEGATIVE
Trichomonas, UA: POSITIVE
Yeast Wet Prep HPF POC: NEGATIVE

## 2015-11-28 MED ORDER — METRONIDAZOLE 500 MG PO TABS: ORAL_TABLET | ORAL | Status: DC

## 2015-11-28 NOTE — Progress Notes (Signed)
   Subjective:    Patient ID: Cindy [name], female    DOB: 1981/05/20, 34 y.o.   MRN: [id number]  HPI  Urine odor and cloudiness about 1 week ago.  Having a bit of burning after urination.  Some discomfort in suprapubic area.  [place] pain.  Does not feel like she completely empties and had to go back to urinate frequently.  No fever.  No vaginal discharge or burning.    After vaginal swab microscopy results:  Pt. Had IUD removed in November after getting back together with female partner after several months hiatus.  Back together in October. Both she and current partner were sexually active with other partners during hiatus She was having dyspareunia and bleeding.  [facility] and continued to have discomfort and some spotting.    Review of Systems     Objective:   Physical Exam  NAD Lungs:  CTA CV:  RRR without murmur or rub Abd:  S, NT, No HSM or masses, tender over suprapubic area mildly, No CVA tenderness.        Assessment & Plan:  Trichomonal vaginitis:  Metronidazole 2 g one dose.  Wrote another Rx for her female partner to get filled and take as well. Discussed need to return for full STD work up next week.

## 2015-12-04 ENCOUNTER — Other Ambulatory Visit (INDEPENDENT_AMBULATORY_CARE_PROVIDER_SITE_OTHER): Payer: Medicaid Other | Admitting: Internal Medicine

## 2015-12-04 DIAGNOSIS — A599 Trichomoniasis, unspecified: Secondary | ICD-10-CM

## 2015-12-05 LAB — GC/CHLAMYDIA PROBE AMP
Chlamydia trachomatis, NAA: NEGATIVE
NEISSERIA GONORRHOEAE BY PCR: NEGATIVE

## 2015-12-05 LAB — RPR QUALITATIVE: RPR: NONREACTIVE

## 2015-12-05 LAB — HIV ANTIBODY (ROUTINE TESTING W REFLEX): HIV SCREEN 4TH GENERATION: NONREACTIVE

## 2015-12-11 ENCOUNTER — Other Ambulatory Visit (INDEPENDENT_AMBULATORY_CARE_PROVIDER_SITE_OTHER): Payer: Self-pay

## 2015-12-11 DIAGNOSIS — R300 Dysuria: Secondary | ICD-10-CM

## 2015-12-11 LAB — POCT URINALYSIS DIPSTICK
Bilirubin, UA: NEGATIVE
Glucose, UA: NEGATIVE
KETONES UA: NEGATIVE
Nitrite, UA: NEGATIVE
PH UA: 6
RBC UA: NEGATIVE
SPEC GRAV UA: 1.015
Urobilinogen, UA: 0.2

## 2015-12-11 LAB — POCT WET PREP WITH KOH
KOH PREP POC: NEGATIVE
RBC WET PREP PER HPF POC: NEGATIVE
Trichomonas, UA: NEGATIVE
WBC WET PREP PER HPF POC: NEGATIVE
Yeast Wet Prep HPF POC: NEGATIVE

## 2015-12-11 NOTE — Progress Notes (Signed)
   Subjective:    Patient ID: Cindy [name], female    DOB: [birth year], 34 y.o.   MRN: [id number]  HPI  Pt. Here just for labs as continues to have suprapubic pressure, odor with urination, mild dysuria.  See last visit--diagnosed with Trichomonas vaginalis. No vaginal discharge. Both pt. And partner treated with 2 g Metronidazole.  They have not had intercourse since treatment.   Pt. Wearing pantiliners regularly--just feels cleaner that way.   Not clear if synthetic or cotton based on description. She does not have time for exam today.    Review of Systems     Objective:   Physical Exam NAD       Assessment & Plan:  Suprapubic pressure/mild dysuria Sending urine for culture. No findings of trichomonas or other concern on wet prep/KOH today. Pt. Has appt. Beginning of January.  Will examine then if still with symptoms

## 2015-12-13 LAB — URINE CULTURE

## 2015-12-16 MED ORDER — CIPROFLOXACIN HCL 500 MG PO TABS: 500.0000 mg | ORAL_TABLET | Freq: Two times a day (BID) | ORAL | Status: DC

## 2015-12-16 NOTE — Addendum Note (Signed)
Addended by: Marcene DuosMULBERRY, Dnya Hickle M on: 12/16/2015 01:38 PM   Modules accepted: Orders

## 2015-12-28 ENCOUNTER — Encounter: Payer: Self-pay | Admitting: Internal Medicine

## 2015-12-28 ENCOUNTER — Ambulatory Visit (INDEPENDENT_AMBULATORY_CARE_PROVIDER_SITE_OTHER): Payer: Self-pay | Admitting: Internal Medicine

## 2015-12-28 VITALS — BP 122/78 | HR 76 | Ht 68.0 in | Wt 256.0 lb

## 2015-12-28 DIAGNOSIS — Z124 Encounter for screening for malignant neoplasm of cervix: Secondary | ICD-10-CM

## 2015-12-28 DIAGNOSIS — F419 Anxiety disorder, unspecified: Secondary | ICD-10-CM

## 2015-12-28 DIAGNOSIS — Z0000 Encounter for general adult medical examination without abnormal findings: Secondary | ICD-10-CM

## 2015-12-28 DIAGNOSIS — Z01419 Encounter for gynecological examination (general) (routine) without abnormal findings: Secondary | ICD-10-CM

## 2015-12-28 DIAGNOSIS — Z79899 Other long term (current) drug therapy: Secondary | ICD-10-CM

## 2015-12-28 NOTE — Progress Notes (Signed)
Subjective:    Patient ID: Cindy [name], female    DOB: 28-Nov-1981, 35 y.o.   MRN: [id number]  HPI   Here for CPE including Pap.    Review of Systems  Constitutional:       Energy a bit low, but feels related to poor eating habits. Back in own home now and can decide what to eat now as compared to living with a friend who did not have healthy food in home.  Respiratory: Negative for shortness of breath.   Cardiovascular: Negative for chest pain and leg swelling.  Gastrointestinal: Positive for abdominal pain, diarrhea and constipation. Negative for blood in stool.       LLQ pain from time to time--rare.  Has been told from left ovary--has not changed much over the years--describes as mid cycle/middleschmerz. Alternating constipation/diarrhea that is controlled pretty well if takes psyllium husks  Genitourinary: Negative for vaginal discharge.       Periods regular.   LMP 12.27.2016. Last 5-7 days.  Musculoskeletal: Negative for arthralgias.  Skin: Negative.   Neurological:       Left toes when wears point toed shoes.  Hematological: Does not bruise/bleed easily.  Psychiatric/Behavioral: The patient is nervous/anxious.        Having anxiety with work.  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   Has been journaling regarding anxiety--seems associated with work.   Once gets to know new coworkers, feels she will be okay.       Objective:   Physical Exam  Constitutional: She is oriented to person, place, and time. She appears well-developed and well-nourished.  HENT:  Head: Normocephalic.  Right Ear: Hearing normal.  Left Ear: Hearing normal.  Nose: Nose normal.  Mouth/Throat: Uvula is midline, oropharynx is clear and moist and mucous membranes are normal.  Bilateral soft cerumen fills canals, though one small opening to TM on Right.  What can be seen of TM is pearly gray  Eyes: Conjunctivae and EOM are normal. Pupils are equal, round, and reactive  to light.  Fundoscopic exam:      The right eye shows red reflex.       The left eye shows red reflex.  Discs sharp bilaterally  Neck: Normal range of motion. Neck supple. No thyroid mass and no thyromegaly present.  Cardiovascular: Normal rate, regular rhythm and intact distal pulses.  Exam reveals no friction rub.   No murmur heard. Pulmonary/Chest: Effort normal and breath sounds normal.  Abdominal: Soft. Bowel sounds are normal. She exhibits no mass. There is no hepatosplenomegaly. There is no tenderness.  Genitourinary:  Breasts:   without focal mass, skin dimpling, nipple discharge or axillary adenopathy.  Genitalia: External genitalia normal without lesion.  No vaginal lesions or discharge. Vaginal mucosa moist. Cervix without lesion.  Pap taken.  No CMT.  No uterine or adnexal mass or tenderness.   Musculoskeletal: Normal range of motion.  Lymphadenopathy:    She has no cervical adenopathy.    She has no axillary adenopathy.       Right: No inguinal and no supraclavicular adenopathy present.       Left: No inguinal and no supraclavicular adenopathy present.  Neurological: She is alert and oriented to person, place, and time. She has normal strength and normal reflexes. No cranial nerve deficit or sensory deficit. Gait normal.  Skin: Skin is warm and dry.  Psychiatric: She has a normal mood and affect. Her speech is normal and  behavior is normal. Judgment and thought content normal. Cognition and memory are normal.          Assessment & Plan:  1.  CPE with Pap: No abnormal findings today. Recommend yearly flu vaccine. Discussed we are done with flu clinics at this time, but to look at [facility] or [facility] Wet prep without abnormal findings today. Check CMP,  FLP.  CBC in June normal--no recheck today.  2.  Anxiety and concerns regarding boyfriend's mental health issues:  Warm Hand off to [employer], LCSW today.

## 2015-12-28 NOTE — Patient Instructions (Signed)
Drink a glass of water before every meal Drink 6-8 glasses of water daily Eat three meals daily Eat a protein and healthy fat with every meal (eggs,fish, chicken, turkey and limit red meats) Eat 5 servings of vegetables daily, mix the colors Eat 2 servings of fruit daily with skin, if skin is edible Use smaller plates Put food/utensils down as you chew and swallow each bite Eat at a table with friends/family at least once daily, no TV Do not eat in front of the TV 

## 2015-12-29 LAB — COMPREHENSIVE METABOLIC PANEL
A/G RATIO: 1.6 (ref 1.1–2.5)
ALBUMIN: 4.2 g/dL (ref 3.5–5.5)
ALT: 11 IU/L (ref 0–32)
AST: 13 IU/L (ref 0–40)
Alkaline Phosphatase: 75 IU/L (ref 39–117)
BUN / CREAT RATIO: 13 (ref 8–20)
BUN: 8 mg/dL (ref 6–20)
Bilirubin Total: 0.4 mg/dL (ref 0.0–1.2)
CALCIUM: 9.2 mg/dL (ref 8.7–10.2)
CO2: 23 mmol/L (ref 18–29)
Chloride: 102 mmol/L (ref 96–106)
Creatinine, Ser: 0.62 mg/dL (ref 0.57–1.00)
GFR, EST AFRICAN AMERICAN: 134 mL/min/{1.73_m2} (ref >59)
GFR, EST NON AFRICAN AMERICAN: 116 mL/min/{1.73_m2} (ref >59)
GLOBULIN, TOTAL: 2.7 g/dL (ref 1.5–4.5)
Glucose: 82 mg/dL (ref 65–99)
POTASSIUM: 4.9 mmol/L (ref 3.5–5.2)
SODIUM: 142 mmol/L (ref 134–144)
TOTAL PROTEIN: 6.9 g/dL (ref 6.0–8.5)

## 2015-12-31 LAB — CYTOLOGY - PAP

## 2016-01-02 ENCOUNTER — Telehealth: Payer: Self-pay

## 2016-01-02 NOTE — Telephone Encounter (Signed)
No message needed °

## 2016-01-03 ENCOUNTER — Telehealth: Payer: Self-pay | Admitting: Internal Medicine

## 2016-01-03 LAB — LIPID PANEL W/O CHOL/HDL RATIO
CHOLESTEROL TOTAL: 181 mg/dL (ref 100–199)
HDL: 65 mg/dL (ref >39)
LDL Calculated: 102 mg/dL — ABNORMAL HIGH (ref 0–99)
TRIGLYCERIDES: 72 mg/dL (ref 0–149)
VLDL Cholesterol Cal: 14 mg/dL (ref 5–40)

## 2016-01-03 LAB — SPECIMEN STATUS REPORT

## 2016-01-03 NOTE — Telephone Encounter (Signed)
Accidentally signed off on cholesterol panel for patient--please call and let her know it looks fine, espcecially her high good cholesterol at 65.

## 2016-01-04 NOTE — Telephone Encounter (Signed)
Left voice message to return call 

## 2016-01-16 ENCOUNTER — Ambulatory Visit (INDEPENDENT_AMBULATORY_CARE_PROVIDER_SITE_OTHER): Payer: Self-pay | Admitting: Licensed Clinical Social Worker

## 2016-01-16 DIAGNOSIS — F419 Anxiety disorder, unspecified: Secondary | ICD-10-CM

## 2016-01-16 NOTE — Telephone Encounter (Signed)
Patient informed. 

## 2016-01-17 NOTE — Progress Notes (Signed)
   THERAPY PROGRESS NOTE  Session Time: 60 mins  Participation Level: Active  Behavioral Response: Well GroomedAlertEuthymic  Type of Therapy: Individual Therapy  Treatment Goals addressed: Anxiety  Interventions: Supportive  Summary: DUAA STELZNER is a 35 y.o. female who presents with a euthymic mood and appropriate affect. Faven reports that she is seeking counseling to deal with her occasional anxiety and to learn more about how to deal with her partner, Chrissie Noa, who has schizophrenia. Ginny reports that she really loves her partner, but that his unpredictable moods make it difficult to live with him and that she worried about exposing her 20 year old son, Jeri Modena, to his unpredictable moods. She wants to learn more about how to respond to his moods.  Mariapaula reports feeling restless about 2 times per week and reports emotional eating.   Suicidal/Homicidal: Nowithout intent/plan  Therapist Response: Social Work Intern (SWI) utilized active listening to encourage Cadience to share her feelings. SWI utilized reflections to express empathy and to get Andalyn to expand on how she was feeling. SWI began clinical assessment and talked about what kind of resources Vina may find helpful to learn about schizophrenia. SWI also talked about the possibility of helping Monroe find a support group for people who live with schizophrenics.   Plan: Return again in 1 weeks.  Diagnosis: Axis I: Pending    Axis II: Pending    Waldon Reining, Student-SW 01/17/2016

## 2016-01-23 ENCOUNTER — Other Ambulatory Visit: Payer: Medicaid Other | Admitting: Licensed Clinical Social Worker

## 2016-01-31 ENCOUNTER — Telehealth: Payer: Self-pay | Admitting: Licensed Clinical Social Worker

## 2016-01-31 NOTE — Telephone Encounter (Signed)
LCSW called pt to follow up after her no-show for counseling appointment. Pt reported that she did not think counseling with social work intern "was a good fit" but would consider making an appt with LCSW. Pt reported that she will think about it and call back.

## 2016-02-22 ENCOUNTER — Ambulatory Visit (INDEPENDENT_AMBULATORY_CARE_PROVIDER_SITE_OTHER): Payer: Self-pay | Admitting: Internal Medicine

## 2016-02-22 ENCOUNTER — Encounter: Payer: Self-pay | Admitting: Internal Medicine

## 2016-02-22 VITALS — BP 114/70 | HR 74 | Ht 68.0 in | Wt 265.0 lb

## 2016-02-22 DIAGNOSIS — R51 Headache: Secondary | ICD-10-CM

## 2016-02-22 DIAGNOSIS — Z23 Encounter for immunization: Secondary | ICD-10-CM

## 2016-02-22 DIAGNOSIS — R519 Headache, unspecified: Secondary | ICD-10-CM

## 2016-02-22 DIAGNOSIS — R609 Edema, unspecified: Secondary | ICD-10-CM

## 2016-02-22 DIAGNOSIS — J302 Other seasonal allergic rhinitis: Secondary | ICD-10-CM

## 2016-02-22 MED ORDER — MOMETASONE FUROATE 50 MCG/ACT NA SUSP: NASAL | Status: DC

## 2016-02-22 MED ORDER — DESLORATADINE 5 MG PO TABS: ORAL_TABLET | ORAL | Status: DC

## 2016-02-22 NOTE — Progress Notes (Signed)
   Subjective:    Patient ID: Cindy [name], female    DOB: 18-Jun-1981, 35 y.o.   MRN: [id number]  HPI   Bad Headache yesterday.  Vision was blurry and felt heart racing.  Left work and took 800 mg Ibuprofen.  Blurriness of vision is new to her.  Normally, just has photo and phonophobia. Headache started bifrontal, constant dull pain, not throbbing.  No nausea or vomiting.  No focal numbness, tingling or weakness in extremity or face.   There have been fellow employees with illness recently.   Drank a lot of water.    Started with headaches age 35 yo. In past, has also had black dots in visual field.  Today, is having the black dot with left eye only. Changes in temps at work and outside.  Does have seasonal allergies  Having swelling of legs again.  Started about 2 days ago.  No shortness of breath.  Sitting at desk more.  No salty meals.      Meds: Furosemide 40 mg once daily as needed for leg swelling KDur 10 mEq once daily when take Fursemide MV daily Ibuprofen 800 mg as needed   Allergies:  Codeine and mushrooms   Review of Systems     Objective:   Physical Exam  HEENT: PERRL, EOMI, discs sharp, conjunctivae without injection, TM pearly gray on left, cerumen impaction on left, throat with mild cobbling, tender over maxillary sinuses Neck:  Supple, no adenopathy Chest:  CTA CV:  RRR without murmur or rub, radial pulses normal and equal Neuro:  A & O x3, CN II-XII grossly intact, MOtor 5/5, DTRs 2+/4 throughout, gait normal LE with pitting edema to pretibial area.        Assessment & Plan:  Headache:  Not clear this was migrainous.  Continue the Ibuprofen as needed.  No concerning findings today  Seasonal Allergies:  Fexofenadine for now, Rx for Clarinex and Nasonex to MAP   Peripheral Edema:  Use Furosemide and potassium as needed.  Recommend thigh high compression stockings at work as legs dependent much of day.  Order form for less expensive stockings  given.  Cerumen impaction:  Right ear irrigated with good resolution of impaction.  Hearing subjectively improved  Influenza vaccine today.

## 2016-02-22 NOTE — Patient Instructions (Signed)
Ibuprofen 200 mg 2-4 tabs every 6 hours as needed for headache pain--always take with food Fexofenadine 180 mg once daily (Allegra) for allergies until the Clarinex and Nasonex in--stop once you start the Clarinex Measure ankle, largest circumference of calf and mid thigh first thing in morning and use these measurements to order thigh high compression stockings Make sure you are getting up and walking around at work regularly

## 2016-03-12 ENCOUNTER — Telehealth: Payer: Self-pay | Admitting: Internal Medicine

## 2016-03-12 DIAGNOSIS — R519 Headache, unspecified: Secondary | ICD-10-CM

## 2016-03-12 DIAGNOSIS — R51 Headache: Principal | ICD-10-CM

## 2016-03-12 NOTE — Telephone Encounter (Signed)
Patient having headaches and blurred vision.  Taking OTC med Ibuprofen and is not working.  Has been diagnosed with cluster migraine the past.  Please call patient at [phone].

## 2016-03-13 MED ORDER — IBUPROFEN 800 MG PO TABS: 800.0000 mg | ORAL_TABLET | Freq: Three times a day (TID) | ORAL | Status: DC | PRN

## 2016-03-13 NOTE — Telephone Encounter (Signed)
Patient states she needs prescription for ibuprofen. She is having headaches and took 1800mg  OTC with no relief.  She will make appointment with eye doctor to see if it's her eyes. Sent to Md

## 2016-03-19 NOTE — Telephone Encounter (Signed)
Patient informed of Rx and instructions. Patient states her job is not causing her stress. Patient states her vision is blurry when she is looking at the monitors at work. Patient has appointment for her eye exam on 03/24/16. Patient to call back after appointment.

## 2016-04-30 ENCOUNTER — Telehealth: Payer: Self-pay | Admitting: Internal Medicine

## 2016-04-30 NOTE — Telephone Encounter (Signed)
Patient called today stating her first day of her last period was 03/15/16 and she had a pregnancy test done last Saturday and it was positive. Now patient wants to know what she needs to do and how she can start going to an OB. Does Dr. Delrae AlfredMulberry needs to refer to OB? Patient also wants to have Rx send for prenatal vitamins. Patient has Medicaid. Please advise

## 2016-05-01 ENCOUNTER — Inpatient Hospital Stay (HOSPITAL_COMMUNITY)
Admission: AD | Admit: 2016-05-01 | Discharge: 2016-05-01 | Disposition: A | Discharge status: Home / Self Care | Payer: Medicaid Other | Source: Ambulatory Visit | Attending: Obstetrics and Gynecology | Admitting: Obstetrics and Gynecology

## 2016-05-01 ENCOUNTER — Encounter (HOSPITAL_COMMUNITY): Payer: Self-pay | Admitting: *Deleted

## 2016-05-01 ENCOUNTER — Inpatient Hospital Stay (HOSPITAL_COMMUNITY): Payer: Medicaid Other

## 2016-05-01 ENCOUNTER — Other Ambulatory Visit: Payer: Self-pay | Admitting: Internal Medicine

## 2016-05-01 DIAGNOSIS — O26891 Other specified pregnancy related conditions, first trimester: Secondary | ICD-10-CM

## 2016-05-01 DIAGNOSIS — Z3A01 Less than 8 weeks gestation of pregnancy: Secondary | ICD-10-CM | POA: Diagnosis not present

## 2016-05-01 DIAGNOSIS — R109 Unspecified abdominal pain: Secondary | ICD-10-CM | POA: Diagnosis present

## 2016-05-01 DIAGNOSIS — Z885 Allergy status to narcotic agent status: Secondary | ICD-10-CM | POA: Diagnosis not present

## 2016-05-01 DIAGNOSIS — R102 Pelvic and perineal pain: Secondary | ICD-10-CM | POA: Insufficient documentation

## 2016-05-01 DIAGNOSIS — Z3490 Encounter for supervision of normal pregnancy, unspecified, unspecified trimester: Secondary | ICD-10-CM

## 2016-05-01 LAB — URINALYSIS, ROUTINE W REFLEX MICROSCOPIC
Bilirubin Urine: NEGATIVE
GLUCOSE, UA: NEGATIVE mg/dL
HGB URINE DIPSTICK: NEGATIVE
Ketones, ur: NEGATIVE mg/dL
Leukocytes, UA: NEGATIVE
Nitrite: NEGATIVE
PROTEIN: NEGATIVE mg/dL
Specific Gravity, Urine: >1.03 — ABNORMAL HIGH (ref 1.005–1.030)
pH: 6 (ref 5.0–8.0)

## 2016-05-01 LAB — WET PREP, GENITAL
Clue Cells Wet Prep HPF POC: NONE SEEN
Sperm: NONE SEEN
Trich, Wet Prep: NONE SEEN
Yeast Wet Prep HPF POC: NONE SEEN

## 2016-05-01 LAB — CBC
HCT: 36.3 % (ref 36.0–46.0)
Hemoglobin: 12.6 g/dL (ref 12.0–15.0)
MCH: 30.3 pg (ref 26.0–34.0)
MCHC: 34.7 g/dL (ref 30.0–36.0)
MCV: 87.3 fL (ref 78.0–100.0)
PLATELETS: 257 10*3/uL (ref 150–400)
RBC: 4.16 MIL/uL (ref 3.87–5.11)
RDW: 12.6 % (ref 11.5–15.5)
WBC: 13.2 10*3/uL — ABNORMAL HIGH (ref 4.0–10.5)

## 2016-05-01 LAB — HCG, QUANTITATIVE, PREGNANCY: HCG, BETA CHAIN, QUANT, S: 64484 m[IU]/mL — AB (ref <5)

## 2016-05-01 LAB — POCT PREGNANCY, URINE: PREG TEST UR: POSITIVE — AB

## 2016-05-01 LAB — ABO/RH: ABO/RH(D): O POS

## 2016-05-01 MED ORDER — PRENATAL VITAMINS PLUS 27-1 MG PO TABS: ORAL_TABLET | ORAL | Status: DC

## 2016-05-01 NOTE — MAU Note (Signed)
Pt had a positive HPT. Has had cramping in her LLQ on and off since Saturday.Pt also c/o feling very fatigued and blotting.

## 2016-05-01 NOTE — Discharge Instructions (Signed)
First Trimester of Pregnancy  The first trimester of pregnancy is from week 1 until the end of week 12 (months 1 through 3). A week after a sperm fertilizes an egg, the egg will implant on the wall of the uterus. This embryo will begin to develop into a baby. Genes from you and your partner are forming the baby. The female genes determine whether the baby is a boy or a girl. At 6-8 weeks, the eyes and face are formed, and the heartbeat can be seen on ultrasound. At the end of 12 weeks, all the baby's organs are formed.   Now that you are pregnant, you will want to do everything you can to have a healthy baby. Two of the most important things are to get good prenatal care and to follow your health care provider's instructions. Prenatal care is all the medical care you receive before the baby's birth. This care will help prevent, find, and treat any problems during the pregnancy and childbirth.  BODY CHANGES  Your body goes through many changes during pregnancy. The changes vary from woman to woman.   · You may gain or lose a couple of pounds at first.  · You may feel sick to your stomach (nauseous) and throw up (vomit). If the vomiting is uncontrollable, call your health care provider.  · You may tire easily.  · You may develop headaches that can be relieved by medicines approved by your health care provider.  · You may urinate more often. Painful urination may mean you have a bladder infection.  · You may develop heartburn as a result of your pregnancy.  · You may develop constipation because certain hormones are causing the muscles that push waste through your intestines to slow down.  · You may develop hemorrhoids or swollen, bulging veins (varicose veins).  · Your breasts may begin to grow larger and become tender. Your nipples may stick out more, and the tissue that surrounds them (areola) may become darker.  · Your gums may bleed and may be sensitive to brushing and flossing.   · Dark spots or blotches (chloasma, mask of pregnancy) may develop on your face. This will likely fade after the baby is born.  · Your menstrual periods will stop.  · You may have a loss of appetite.  · You may develop cravings for certain kinds of food.  · You may have changes in your emotions from day to day, such as being excited to be pregnant or being concerned that something may go wrong with the pregnancy and baby.  · You may have more vivid and strange dreams.  · You may have changes in your hair. These can include thickening of your hair, rapid growth, and changes in texture. Some women also have hair loss during or after pregnancy, or hair that feels dry or thin. Your hair will most likely return to normal after your baby is born.  WHAT TO EXPECT AT YOUR PRENATAL VISITS  During a routine prenatal visit:  · You will be weighed to make sure you and the baby are growing normally.  · Your blood pressure will be taken.  · Your abdomen will be measured to track your baby's growth.  · The fetal heartbeat will be listened to starting around week 10 or 12 of your pregnancy.  · Test results from any previous visits will be discussed.  Your health care provider may ask you:  · How you are feeling.  · If you   including cigarettes, chewing tobacco, and electronic cigarettes. °· If you have any questions. °Other tests that may be performed during your first trimester include: °· Blood tests to find your blood type and to check for the presence of any previous infections. They will also be used to check for low iron levels (anemia) and Rh antibodies. Later in the pregnancy, blood tests for diabetes will be done along with other tests if problems develop. °· Urine tests to check for infections, diabetes, or protein in the urine. °· An ultrasound to confirm the proper growth  and development of the baby. °· An amniocentesis to check for possible genetic problems. °· Fetal screens for spina bifida and Down syndrome. °· You may need other tests to make sure you and the baby are doing well. °· HIV (human immunodeficiency virus) testing. Routine prenatal testing includes screening for HIV, unless you choose not to have this test. °HOME CARE INSTRUCTIONS  °Medicines °· Follow your health care provider's instructions regarding medicine use. Specific medicines may be either safe or unsafe to take during pregnancy. °· Take your prenatal vitamins as directed. °· If you develop constipation, try taking a stool softener if your health care provider approves. °Diet °· Eat regular, well-balanced meals. Choose a variety of foods, such as meat or vegetable-based protein, fish, milk and low-fat dairy products, vegetables, fruits, and whole grain breads and cereals. Your health care provider will help you determine the amount of weight gain that is right for you. °· Avoid raw meat and uncooked cheese. These carry germs that can cause birth defects in the baby. °· Eating four or five small meals rather than three large meals a day may help relieve nausea and vomiting. If you start to feel nauseous, eating a few soda crackers can be helpful. Drinking liquids between meals instead of during meals also seems to help nausea and vomiting. °· If you develop constipation, eat more high-fiber foods, such as fresh vegetables or fruit and whole grains. Drink enough fluids to keep your urine clear or pale yellow. °Activity and Exercise °· Exercise only as directed by your health care provider. Exercising will help you: °¨ Control your weight. °¨ Stay in shape. °¨ Be prepared for labor and delivery. °· Experiencing pain or cramping in the lower abdomen or low back is a good sign that you should stop exercising. Check with your health care provider before continuing normal exercises. °· Try to avoid standing for long  periods of time. Move your legs often if you must stand in one place for a long time. °· Avoid heavy lifting. °· Wear low-heeled shoes, and practice good posture. °· You may continue to have sex unless your health care provider directs you otherwise. °Relief of Pain or Discomfort °· Wear a good support bra for breast tenderness.   °· Take warm sitz baths to soothe any pain or discomfort caused by hemorrhoids. Use hemorrhoid cream if your health care provider approves.   °· Rest with your legs elevated if you have leg cramps or low back pain. °· If you develop varicose veins in your legs, wear support hose. Elevate your feet for 15 minutes, 3-4 times a day. Limit salt in your diet. °Prenatal Care °· Schedule your prenatal visits by the twelfth week of pregnancy. They are usually scheduled monthly at first, then more often in the last 2 months before delivery. °· Write down your questions. Take them to your prenatal visits. °· Keep all your prenatal visits as directed by your   health care provider. Safety  Wear your seat belt at all times when driving.  Make a list of emergency phone numbers, including numbers for family, friends, the hospital, and police and fire departments. General Tips  Ask your health care provider for a referral to a local prenatal education class. Begin classes no later than at the beginning of month 6 of your pregnancy.  Ask for help if you have counseling or nutritional needs during pregnancy. Your health care provider can offer advice or refer you to specialists for help with various needs.  Do not use hot tubs, steam rooms, or saunas.  Do not douche or use tampons or scented sanitary pads.  Do not cross your legs for long periods of time.  Avoid cat litter boxes and soil used by cats. These carry germs that can cause birth defects in the baby and possibly loss of the fetus by miscarriage or stillbirth.  Avoid all smoking, herbs, alcohol, and medicines not prescribed by  your health care provider. Chemicals in these affect the formation and growth of the baby.  Do not use any tobacco products, including cigarettes, chewing tobacco, and electronic cigarettes. If you need help quitting, ask your health care provider. You may receive counseling support and other resources to help you quit.  Schedule a dentist appointment. At home, brush your teeth with a soft toothbrush and be gentle when you floss. SEEK MEDICAL CARE IF:   You have dizziness.  You have mild pelvic cramps, pelvic pressure, or nagging pain in the abdominal area.  You have persistent nausea, vomiting, or diarrhea.  You have a bad smelling vaginal discharge.  You have pain with urination.  You notice increased swelling in your face, hands, legs, or ankles. SEEK IMMEDIATE MEDICAL CARE IF:   You have a fever.  You are leaking fluid from your vagina.  You have spotting or bleeding from your vagina.  You have severe abdominal cramping or pain.  You have rapid weight gain or loss.  You vomit blood or material that looks like coffee grounds.  You are exposed to [place] measles and have never had them.  You are exposed to fifth disease or chickenpox.  You develop a severe headache.  You have shortness of breath.  You have any kind of trauma, such as from a fall or a car accident.   This information is not intended to replace advice given to you by your health care provider. Make sure you discuss any questions you have with your health care provider.   Document Released: 12/02/2001 Document Revised: 12/29/2014 Document Reviewed: 10/18/2013 Elsevier Interactive Patient Education 2016 [employer]  Prenatal Care Providers [place]    [facility]  [phone]     Phone: [phone]          [facility]                      [facility]  @[place]     Phone: [phone]  Phone: [phone]         [facility]  [facility]     Phone: [phone]  Phone: [phone]           [facility] [place]                hone: [phone]  Phone: [phone]  [facility] [address] Francoise Ceo      Phone: [phone]  Phone: [phone]         [facility]                Phone: [phone]  [facility]   [phone]    Family [address] [place])          Phone: [phone] [facility]   Phone: [phone] Safe Medications in Pregnancy   Acne: Benzoyl Peroxide Salicylic Acid  Backache/Headache: Tylenol: 2 regular strength every 4 hours OR              2 Extra strength every 6 hours  Colds/Coughs/Allergies: Benadryl (alcohol free) 25 mg every 6 hours as needed Breath right strips Claritin Cepacol throat lozenges Chloraseptic throat spray Cold-Eeze- up to three times per day Cough drops, alcohol free Flonase (by prescription only) Guaifenesin Mucinex Robitussin DM (plain only, alcohol free) Saline nasal spray/drops Sudafed (pseudoephedrine) & Actifed ** use only after [redacted] weeks gestation and if you do not have high blood pressure Tylenol Vicks Vaporub Zinc lozenges Zyrtec   Constipation: Colace Ducolax suppositories Fleet enema Glycerin suppositories Metamucil Milk of magnesia Miralax Senokot Smooth move tea  Diarrhea: Kaopectate Imodium A-D  *NO pepto Bismol  Hemorrhoids: Anusol Anusol HC Preparation H Tucks  Indigestion: Tums Maalox Mylanta Zantac  Pepcid  Insomnia: Benadryl (alcohol free)  every 6 hours as needed Tylenol PM Unisom, no Gelcaps  Leg Cramps: Tums MagGel  Nausea/Vomiting:  Bonine Dramamine Emetrol Ginger extract Sea bands Meclizine  Nausea medication to take during pregnancy:  Unisom (doxylamine succinate 25 mg tablets) Take one tablet daily at bedtime. If symptoms are not adequately  controlled, the dose can be increased to a maximum recommended dose of two tablets daily (1/2 tablet in the morning, 1/2 tablet mid-afternoon and one at bedtime). Vitamin B6  tablets. Take one tablet twice a day (up to 200 mg per day).  Skin Rashes: Aveeno products Benadryl cream or  every 6 hours as needed Calamine Lotion 1% cortisone cream  Yeast infection: Gyne-lotrimin 7 Monistat 7   **If taking multiple medications, please check labels to avoid duplicating the same active ingredients **take medication as directed on the label ** Do not exceed 4000 mg of tylenol in 24 hours **Do not take medications that contain aspirin or ibuprofen

## 2016-05-01 NOTE — Telephone Encounter (Signed)
She should be able to call and find out which OBs take Medicaid.   Does she have a preference? Has she had a formal pregnancy test done at a clinic? I will send in Rx for PNV

## 2016-05-01 NOTE — MAU Provider Note (Signed)
Chief Complaint: Abdominal Cramping   None     SUBJECTIVE HPI: Cindy [name] is a 35 y.o. G3P2002 at 5939w5d by LMP who presents to maternity admissions reporting abdominal cramping x 1 week, single episode of vaginal spotting 3-4 days ago that resolved, and positive pregnancy test 2 weeks ago.  She reports the cramping is low in her abdomen, intermittent, and not resolved with rest.  The bleeding occurred while bathing when she saw pink on the washcloth but she has not seen any bleeding since then.  She has an appointment with her primary care doctor tomorrow and plans to request a referral to an OB/Gyn to begin prenatal care.   She denies vaginal itching/burning, urinary symptoms, h/a, dizziness, n/v, or fever/chills.     HPI  Past Medical History  Diagnosis Date  . No pertinent past medical history   . Cellulitis of calf   . Headache(784.0)     Muscle tension headaches.  Marland Kitchen. UTI (lower urinary tract infection)   . Edema of left foot     onset 9 years   Past Surgical History  Procedure Laterality Date  . Hand surgery  2006    ORIF of left index and middle finger distal MC bones  . Wisdom tooth extraction  High School    All 4  . Leep  2004   Social History   Social History  . Marital Status: Single    Spouse Name: William-boyfriend  . Number of Children: 2  . Years of Education: 16   Occupational History  . [employer]    Social History Main Topics  . Smoking status: Never Smoker   . Smokeless tobacco: Never Used  . Alcohol Use: Yes     Comment: Social, occas.  . Drug Use: No  . Sexual Activity: Yes    Birth Control/ Protection: None     Comment: IUD caused weight gain.  OCPs caused fluid retention, Nuvaring not interested in.  Never tried a diaphragm.   Other Topics Concern  . Not on file   Social History Narrative   [place] in [place]    Boyfriend is Bipolar and possibly carried diagnosis of Schizophrenia--working on his meds right  now.   Could use some help in knowing how to deal with his ups and downs.   37 yo son at [employer] [facility] [employer] [place]   Originally from [place]   Graduated from [employer]      No current facility-administered medications on file prior to encounter.   Current Outpatient Prescriptions on File Prior to Encounter  Medication Sig Dispense Refill  . desloratadine (CLARINEX) 5 MG tablet 1 tab by mouth daily as needed for allergies 30 tablet 11  . furosemide (LASIX) 40 MG tablet Take 1 tablet by mouth daily as needed for fluid.   0  . ibuprofen (ADVIL,MOTRIN) 800 MG tablet Take 1 tablet (800 mg total) by mouth every 8 (eight) hours as needed. 30 tablet 0  . mometasone (NASONEX) 50 MCG/ACT nasal spray 2 sprays each nostril daily 17 g 11  . Multiple Vitamin (MULTIVITAMIN WITH MINERALS) TABS tablet Take 1 tablet by mouth daily.    . potassium chloride (K-DUR) 10 MEQ tablet Take 1 tablet by mouth daily.  0   Allergies  Allergen Reactions  . Codeine Other (See Comments)    bad headache  . Mushroom Extract Complex Other (See Comments)    Itching of tongue and eyes    ROS:  Review of Systems  Constitutional: Negative for fever, chills and fatigue.  Respiratory: Negative for shortness of breath.   Cardiovascular: Negative for chest pain.  Gastrointestinal: Positive for abdominal pain.  Genitourinary: Positive for vaginal bleeding and pelvic pain. Negative for dysuria, flank pain, vaginal discharge, difficulty urinating and vaginal pain.  Neurological: Negative for dizziness and headaches.  Psychiatric/Behavioral: Negative.      I have reviewed patient's Past Medical Hx, Surgical Hx, Family Hx, Social Hx, medications and allergies.   Physical Exam  Patient Vitals for the past 24 hrs:  BP Temp Pulse Resp Height Weight  05/01/16 1827 102/58 mmHg - 77 - - -  05/01/16 1826 (!) 103/53 mmHg - 74 - - -  05/01/16 1824 109/58 mmHg - 71 - - -  05/01/16 1821 108/57 mmHg - 70 - -  -  05/01/16 1718 102/63 mmHg 98.3 F (36.8 C) 76 18 5' 8.25" (1.734 m) 277 lb 1.9 oz (125.701 kg)   Constitutional: Well-developed, well-nourished female in no acute distress.  Cardiovascular: normal rate Respiratory: normal effort GI: Abd soft, non-tender. Pos BS x 4 MS: Extremities nontender, no edema, normal ROM Neurologic: Alert and oriented x 4.  GU: Neg CVAT.  PELVIC EXAM: Cervix pink, visually closed, without lesion, scant white creamy discharge, vaginal walls and external genitalia normal Bimanual exam: Cervix 0/long/high, firm, anterior, neg CMT, uterus nontender, nonenlarged, adnexa without tenderness, enlargement, or mass   LAB RESULTS Results for orders placed or performed during the hospital encounter of 05/01/16 (from the past 24 hour(s))  Urinalysis, Routine w reflex microscopic (not at [facility])     Status: Abnormal   Collection Time: 05/01/16  5:20 PM  Result Value Ref Range   Color, Urine YELLOW YELLOW   APPearance CLEAR CLEAR   Specific Gravity, Urine >1.030 (H) 1.005 - 1.030   pH 6.0 5.0 - 8.0   Glucose, UA NEGATIVE NEGATIVE mg/dL   Hgb urine dipstick NEGATIVE NEGATIVE   Bilirubin Urine NEGATIVE NEGATIVE   Ketones, ur NEGATIVE NEGATIVE mg/dL   Protein, ur NEGATIVE NEGATIVE mg/dL   Nitrite NEGATIVE NEGATIVE   Leukocytes, UA NEGATIVE NEGATIVE  CBC     Status: Abnormal   Collection Time: 05/01/16  5:48 PM  Result Value Ref Range   WBC 13.2 (H) 4.0 - 10.5 K/uL   RBC 4.16 3.87 - 5.11 MIL/uL   Hemoglobin 12.6 12.0 - 15.0 g/dL   HCT 78.2 95.6 - 21.3 %   MCV 87.3 78.0 - 100.0 fL   MCH 30.3 26.0 - 34.0 pg   MCHC 34.7 30.0 - 36.0 g/dL   RDW 08.6 57.8 - 46.9 %   Platelets 257 150 - 400 K/uL  hCG, quantitative, pregnancy     Status: Abnormal   Collection Time: 05/01/16  5:48 PM  Result Value Ref Range   hCG, Beta Chain, Quant, S 64484 (H) <5 mIU/mL  ABO/Rh     Status: None (Preliminary result)   Collection Time: 05/01/16  5:48 PM  Result Value Ref Range    ABO/RH(D) O POS   Pregnancy, urine POC     Status: Abnormal   Collection Time: 05/01/16  6:13 PM  Result Value Ref Range   Preg Test, Ur POSITIVE (A) NEGATIVE    --/--/O POS (05/11 1748)  IMAGING US Ob Comp Less 14 Wks  05/01/2016  CLINICAL DATA:  35 year old pregnant female with left lower quadrant abdominal pain EXAM: OBSTETRIC <14 WK [place] AND TRANSVAGINAL OB US TECHNIQUE: Both transabdominal and transvaginal ultrasound examinations  were performed for complete evaluation of the gestation as well as the maternal uterus, adnexal regions, and pelvic cul-de-sac. Transvaginal technique was performed to assess early pregnancy. COMPARISON:  None for this pregnancy FINDINGS: Intrauterine gestational sac: Single intrauterine gestational sac Yolk sac:  Seen Embryo:  Present Cardiac Activity: Detected Heart Rate: 112  bpm CRL:  7  mm   6 w   4 d                  [place] EDC: 12/22/2015 Subchorionic hemorrhage:  Small subchorionic hemorrhage noted. Maternal uterus/adnexae: The maternal ovaries appear unremarkable. The right ovary measures 3.2 x 1.9 x 3.1 cm and the left ovary measures 3.1 x 3.0 x 2.6 cm. A corpus luteum may be present within the left ovary. No free fluid identified within the pelvis. IMPRESSION: Single live intrauterine pregnancy with an estimated gestational age of [redacted] weeks, 4 days. Small subchorionic hemorrhage. Electronically Signed   By: Elgie Collard M.D.   On: 05/01/2016 19:56   US Ob Transvaginal  05/01/2016  CLINICAL DATA:  35 year old pregnant female with left lower quadrant abdominal pain EXAM: OBSTETRIC <14 WK [place] AND TRANSVAGINAL OB US TECHNIQUE: Both transabdominal and transvaginal ultrasound examinations were performed for complete evaluation of the gestation as well as the maternal uterus, adnexal regions, and pelvic cul-de-sac. Transvaginal technique was performed to assess early pregnancy. COMPARISON:  None for this pregnancy FINDINGS: Intrauterine gestational sac: Single intrauterine  gestational sac Yolk sac:  Seen Embryo:  Present Cardiac Activity: Detected Heart Rate: 112  bpm CRL:  7  mm   6 w   4 d                  [place] EDC: 12/22/2015 Subchorionic hemorrhage:  Small subchorionic hemorrhage noted. Maternal uterus/adnexae: The maternal ovaries appear unremarkable. The right ovary measures 3.2 x 1.9 x 3.1 cm and the left ovary measures 3.1 x 3.0 x 2.6 cm. A corpus luteum may be present within the left ovary. No free fluid identified within the pelvis. IMPRESSION: Single live intrauterine pregnancy with an estimated gestational age of [redacted] weeks, 4 days. Small subchorionic hemorrhage. Electronically Signed   By: Elgie Collard M.D.   On: 05/01/2016 19:56    Report to Venia Carbon, NP, with labs and  [place] pending.    Assessment/Plan  1. Intrauterine pregnancy   2. Pelvic pain affecting pregnancy in first trimester, antepartum   3. [redacted] weeks gestation of pregnancy    DC home Comfort measures reviewed  1st Trimester precautions  Bleeding precautions RX: none  Return to MAU as needed FU with OB as planned  Follow-up Information    Please follow up.   Contact information:   Your primary care doctor as scheduled for tomorrow.        Sharen Counter Certified Nurse-Midwife 05/01/2016  6:58 PM

## 2016-05-02 ENCOUNTER — Encounter: Payer: Self-pay | Admitting: Internal Medicine

## 2016-05-02 ENCOUNTER — Ambulatory Visit (INDEPENDENT_AMBULATORY_CARE_PROVIDER_SITE_OTHER): Payer: Medicaid Other | Admitting: Internal Medicine

## 2016-05-02 VITALS — BP 110/64 | HR 69 | Resp 17 | Ht 68.0 in | Wt 277.4 lb

## 2016-05-02 DIAGNOSIS — N63 Unspecified lump in breast: Secondary | ICD-10-CM

## 2016-05-02 DIAGNOSIS — N6310 Unspecified lump in the right breast, unspecified quadrant: Secondary | ICD-10-CM

## 2016-05-02 DIAGNOSIS — Z3490 Encounter for supervision of normal pregnancy, unspecified, unspecified trimester: Secondary | ICD-10-CM

## 2016-05-02 DIAGNOSIS — Z331 Pregnant state, incidental: Secondary | ICD-10-CM | POA: Diagnosis not present

## 2016-05-02 LAB — GC/CHLAMYDIA PROBE AMP (~~LOC~~) NOT AT ARMC
CHLAMYDIA, DNA PROBE: NEGATIVE
Neisseria Gonorrhea: NEGATIVE

## 2016-05-02 LAB — HIV ANTIBODY (ROUTINE TESTING W REFLEX): HIV Screen 4th Generation wRfx: NONREACTIVE

## 2016-05-02 LAB — POCT URINE PREGNANCY: PREG TEST UR: POSITIVE — AB

## 2016-05-02 NOTE — Progress Notes (Signed)
   Subjective:    Patient ID: Cindy [name], female    DOB: 1981/11/07, 35 y.o.   MRN: [id number]  HPI   Bump under right breast for a couple of weeks:  Started like pieces of sand under skin and then merged into 1 knot.  Tender and size of grape.  Not hard and is moveable.    Needs referral for OB/Gyn:  First day of last period was 03/15/2016.  She missed in April.  Home pregnancy test was +.       Current outpatient prescriptions:  .  prenatal vitamin w/FE, FA (PRENATAL 1 + 1) 27-1 MG TABS tablet, Take 1 tablet by mouth daily at 12 noon. 1 tab by mouth daily, Disp: , Rfl:    Allergies  Allergen Reactions  . Codeine Other (See Comments)    bad headache  . Mushroom Extract Complex Other (See Comments)    Itching of tongue and eyes    Review of Systems     Objective:   Physical Exam Breasts:  No focal mass or skin dimpling of right breast, no axillary adenopathy.  Pt. Unable to find what she was concerned about as well.  She has mild tenderness in right lower medial breast, but this is with palpating what feels like glandular tissue.       Assessment & Plan:  1.  MIssed period with + pregnancy test.  Patient did not share she [facility] and underwent testing for pregnancy until after I had tested her urine for B HCG.  Will refer to Dr. Berenda Moraleichardson's office.  + urine HCG here as well. She is not taking the Furosemide or potassium, which must have been removed from her medication list yesterday as no longer listed. PNV sent yesterday as well.  2.  Right breast concerns:  No findings

## 2016-05-02 NOTE — Patient Instructions (Signed)
STay physically active and eat healthily. Discussed EBT card usage at [employer]

## 2016-05-05 ENCOUNTER — Encounter (HOSPITAL_COMMUNITY): Payer: Self-pay | Admitting: Emergency Medicine

## 2016-05-05 ENCOUNTER — Emergency Department (HOSPITAL_COMMUNITY): Payer: Medicaid Other

## 2016-05-05 ENCOUNTER — Emergency Department (HOSPITAL_COMMUNITY)
Admission: EM | Admit: 2016-05-05 | Discharge: 2016-05-05 | Disposition: A | Discharge status: Home / Self Care | Payer: Medicaid Other | Attending: Emergency Medicine | Admitting: Emergency Medicine

## 2016-05-05 DIAGNOSIS — R002 Palpitations: Secondary | ICD-10-CM | POA: Insufficient documentation

## 2016-05-05 DIAGNOSIS — R42 Dizziness and giddiness: Secondary | ICD-10-CM | POA: Diagnosis not present

## 2016-05-05 DIAGNOSIS — R0789 Other chest pain: Secondary | ICD-10-CM

## 2016-05-05 DIAGNOSIS — Z3A01 Less than 8 weeks gestation of pregnancy: Secondary | ICD-10-CM | POA: Insufficient documentation

## 2016-05-05 DIAGNOSIS — O26891 Other specified pregnancy related conditions, first trimester: Secondary | ICD-10-CM | POA: Diagnosis not present

## 2016-05-05 DIAGNOSIS — R1030 Lower abdominal pain, unspecified: Secondary | ICD-10-CM | POA: Insufficient documentation

## 2016-05-05 DIAGNOSIS — Z79899 Other long term (current) drug therapy: Secondary | ICD-10-CM | POA: Diagnosis not present

## 2016-05-05 LAB — I-STAT TROPONIN, ED: Troponin i, poc: 0 ng/mL (ref 0.00–0.08)

## 2016-05-05 LAB — CBC
HEMATOCRIT: 37.7 % (ref 36.0–46.0)
HEMOGLOBIN: 12.8 g/dL (ref 12.0–15.0)
MCH: 30.2 pg (ref 26.0–34.0)
MCHC: 34 g/dL (ref 30.0–36.0)
MCV: 88.9 fL (ref 78.0–100.0)
Platelets: 263 10*3/uL (ref 150–400)
RBC: 4.24 MIL/uL (ref 3.87–5.11)
RDW: 12.6 % (ref 11.5–15.5)
WBC: 14.9 10*3/uL — ABNORMAL HIGH (ref 4.0–10.5)

## 2016-05-05 LAB — BASIC METABOLIC PANEL
ANION GAP: 6 (ref 5–15)
BUN: 7 mg/dL (ref 6–20)
CHLORIDE: 106 mmol/L (ref 101–111)
CO2: 26 mmol/L (ref 22–32)
Calcium: 8.9 mg/dL (ref 8.9–10.3)
Creatinine, Ser: 0.77 mg/dL (ref 0.44–1.00)
GFR calc Af Amer: >60 mL/min (ref >60)
GLUCOSE: 108 mg/dL — AB (ref 65–99)
POTASSIUM: 3.8 mmol/L (ref 3.5–5.1)
Sodium: 138 mmol/L (ref 135–145)

## 2016-05-05 LAB — D-DIMER, QUANTITATIVE: D-Dimer, Quant: 1.08 ug/mL-FEU — ABNORMAL HIGH (ref 0.00–0.50)

## 2016-05-05 MED ORDER — IOPAMIDOL (ISOVUE-370) INJECTION 76%
100.0000 mL | Freq: Once | INTRAVENOUS | Status: AC | PRN
  Administered 2016-05-05: 100 mL via INTRAVENOUS

## 2016-05-05 NOTE — Discharge Instructions (Signed)
Nonspecific Chest Pain Call the [facility] tomorrow to schedule the next available appointment to be evaluated by a cardiologist. Tell office doubt that you were seen here when scheduling the appointment. It is often hard to find the cause of chest pain. There is always a chance that your pain could be related to something serious, such as a heart attack or a blood clot in your lungs. Chest pain can also be caused by conditions that are not life-threatening. If you have chest pain, it is very important to follow up with your doctor.  HOME CARE  If you were prescribed an antibiotic medicine, finish it all even if you start to feel better.  Avoid any activities that cause chest pain.  Do not use any tobacco products, including cigarettes, chewing tobacco, or electronic cigarettes. If you need help quitting, ask your doctor.  Do not drink alcohol.  Take medicines only as told by your doctor.  Keep all follow-up visits as told by your doctor. This is important. This includes any further testing if your chest pain does not go away.  Your doctor may tell you to keep your head raised (elevated) while you sleep.  Make lifestyle changes as told by your doctor. These may include:  Getting regular exercise. Ask your doctor to suggest some activities that are safe for you.  Eating a heart-healthy diet. Your doctor or a diet specialist (dietitian) can help you to learn healthy eating options.  Maintaining a healthy weight.  Managing diabetes, if necessary.  Reducing stress. GET HELP IF:  Your chest pain does not go away, even after treatment.  You have a rash with blisters on your chest.  You have a fever. GET HELP RIGHT AWAY IF:  Your chest pain is worse.  You have an increasing cough, or you cough up blood.  You have severe belly (abdominal) pain.  You feel extremely weak.  You pass out (faint).  You have chills.  You have sudden, unexplained chest  discomfort.  You have sudden, unexplained discomfort in your arms, back, neck, or jaw.  You have shortness of breath at any time.  You suddenly start to sweat, or your skin gets clammy.  You feel nauseous.  You vomit.  You suddenly feel light-headed or dizzy.  Your heart begins to beat quickly, or it feels like it is skipping beats. These symptoms may be an emergency. Do not wait to see if the symptoms will go away. Get medical help right away. Call your local emergency services (911 in the [place] Do not drive yourself to the hospital.   This information is not intended to replace advice given to you by your health care provider. Make sure you discuss any questions you have with your health care provider.   Document Released: 05/26/2008 Document Revised: 12/29/2014 Document Reviewed: 07/14/2014 Elsevier Interactive Patient Education [employer]2016 Elsevier Inc.

## 2016-05-05 NOTE — ED Provider Notes (Signed)
CSN: [id number]     Arrival date & time 05/05/16  1629 History   First MD Initiated Contact with Patient 05/05/16 1828     Chief Complaint  Patient presents with  . Chest Pain  . Dizziness  . Pregnant      (Consider location/radiation/quality/duration/timing/severity/associated sxs/prior Treatment) HPI Complains of lightheadedness and anterior nonradiating substernal pleuritic chest pain onset yesterday afternoon. Symptoms accompanied by palpitations. Feels as if she can't get a good deep breath. No other associated symptoms. Nothing makes symptoms better or worse. No treatment prior to coming here. Past Medical History  Diagnosis Date  . No pertinent past medical history   . Cellulitis of calf   . Headache(784.0)     Muscle tension headaches.  Marland Kitchen UTI (lower urinary tract infection)   . Edema of left foot     onset 9 years   Past Surgical History  Procedure Laterality Date  . Hand surgery  2006    ORIF of left index and middle finger distal MC bones  . Wisdom tooth extraction  High School    All 4  . Leep  2004   Family History  Problem Relation Age of Onset  . Adopted: Yes  . Blindness Maternal Aunt     Not sure if Glaucoma   Social History  Substance Use Topics  . Smoking status: Never Smoker   . Smokeless tobacco: Never Used  . Alcohol Use: Yes     Comment: Social, occas.   OB History    Gravida Para Term Preterm AB TAB SAB Ectopic Multiple Living   Review of Systems  Respiratory: Positive for shortness of breath.   Cardiovascular: Positive for chest pain and palpitations.  Gastrointestinal: Positive for abdominal pain.       Patient also reports low abdominal cramping constant for the past 2 weeks. She was evaluated [facility] on 05/01/2016 with pelvic ultrasound which showed intrauterine pregnancy urinalysis was normal.  Genitourinary:       [redacted] weeks pregnant  Neurological: Positive for light-headedness.  All other systems reviewed  and are negative.     Allergies  Codeine and Mushroom extract complex  Home Medications   Prior to Admission medications   Medication Sig Start Date End Date Taking? Authorizing Provider  prenatal vitamin w/FE, FA (PRENATAL 1 + 1) 27-1 MG TABS tablet Take 1 tablet by mouth daily at 12 noon. 1 tab by mouth daily   Yes Historical Provider, MD   BP 111/66 mmHg  Pulse 77  Temp(Src) 97.8 F (36.6 C) (Oral)  Resp 17  SpO2 99%  LMP 03/15/2016 Physical Exam  Constitutional: She is oriented to person, place, and time. She appears well-developed and well-nourished.  HENT:  Head: Normocephalic and atraumatic.  Eyes: Conjunctivae are normal. Pupils are equal, round, and reactive to light.  Neck: Neck supple. No tracheal deviation present. No thyromegaly present.  Cardiovascular: Normal rate and regular rhythm.   No murmur heard. Pulmonary/Chest: Effort normal and breath sounds normal.  Abdominal: Soft. Bowel sounds are normal. She exhibits no distension. There is no tenderness.  Musculoskeletal: Normal range of motion. She exhibits no edema or tenderness.  Neurological: She is alert and oriented to person, place, and time. Coordination normal.  Skin: Skin is warm and dry. No rash noted.  Psychiatric: She has a normal mood and affect.  Nursing note and vitals reviewed.   ED Course  Procedures (including  critical care time) Labs Review Labs Reviewed  BASIC METABOLIC PANEL - Abnormal; Notable for the following:    Glucose, Bld 108 (*)    All other components within normal limits  CBC - Abnormal; Notable for the following:    WBC 14.9 (*)    All other components within normal limits  I-STAT TROPOININ, ED    Imaging Review No results found. I have personally reviewed and evaluated these images and lab results as part of my medical decision-making.   EKG Interpretation   Date/Time:  Monday May 05 2016 16:38:48 EDT Ventricular Rate:  96 PR Interval:  170 QRS Duration: 92 QT  Interval:  369 QTC Calculation: 466 R Axis:   40 Text Interpretation:  Sinus rhythm Nonspecific T wave abnormality now  evident in Diffuse Confirmed by Ethelda Chick  MD, Pasha Broad [phone]) on 05/05/2016  6:31:35 PM     10:45 PM patient is resting comfortably. She feels improved, without treatment. Results for orders placed or performed during the hospital encounter of 05/05/16  Basic metabolic panel  Result Value Ref Range   Sodium 138 135 - 145 mmol/L   Potassium 3.8 3.5 - 5.1 mmol/L   Chloride 106 101 - 111 mmol/L   CO2 26 22 - 32 mmol/L   Glucose, Bld 108 (H) 65 - 99 mg/dL   BUN 7 6 - 20 mg/dL   Creatinine, Ser 6.04 0.44 - 1.00 mg/dL   Calcium 8.9 8.9 - 54.0 mg/dL   GFR calc non Af Amer >60 >60 mL/min   GFR calc Af Amer >60 >60 mL/min   Anion gap 6 5 - 15  CBC  Result Value Ref Range   WBC 14.9 (H) 4.0 - 10.5 K/uL   RBC 4.24 3.87 - 5.11 MIL/uL   Hemoglobin 12.8 12.0 - 15.0 g/dL   HCT 98.1 19.1 - 47.8 %   MCV 88.9 78.0 - 100.0 fL   MCH 30.2 26.0 - 34.0 pg   MCHC 34.0 30.0 - 36.0 g/dL   RDW 29.5 62.1 - 30.8 %   Platelets 263 150 - 400 K/uL  D-dimer, quantitative (not at [facility])  Result Value Ref Range   D-Dimer, Quant 1.08 (H) 0.00 - 0.50 ug/mL-FEU  I-stat troponin, ED  Result Value Ref Range   Troponin i, poc 0.00 0.00 - 0.08 ng/mL   Comment 3           Ct Angio Chest Pe W/cm &/or Wo Cm  05/05/2016  CLINICAL DATA:  35 year old female with pleuritic chest pain and shortness of breath. Evaluate for PE. EXAM: CT ANGIOGRAPHY CHEST WITH CONTRAST TECHNIQUE: Multidetector CT imaging of the chest was performed using the standard protocol during bolus administration of intravenous contrast. Multiplanar CT image reconstructions and MIPs were obtained to evaluate the vascular anatomy. CONTRAST:  100 cc Isovue 370 COMPARISON:  Radiograph dated 12/26/2013 FINDINGS: The lungs are clear. There is no pleural effusion or pneumothorax. The central airways are patent. The thoracic aorta and the origins  of the great vessels of the aortic arch are patent. No CT evidence of pulmonary embolism. There is no hilar or mediastinal adenopathy. There is no cardiomegaly or pericardial effusion. The esophagus and the thyroid gland are grossly unremarkable. There is no axillary adenopathy. The chest wall soft tissues appear unremarkable. The osseous structures are intact. Review of the MIP images confirms the above findings. IMPRESSION: No CT evidence of pulmonary embolism. Electronically Signed   By: Elgie Collard M.D.   On: 05/05/2016 21:30  [place] Ob Comp Less 14 Wks  05/01/2016  CLINICAL DATA:  35 year old pregnant female with left lower quadrant abdominal pain EXAM: OBSTETRIC <14 WK US AND TRANSVAGINAL OB US TECHNIQUE: Both transabdominal and transvaginal ultrasound examinations were performed for complete evaluation of the gestation as well as the maternal uterus, adnexal regions, and pelvic cul-de-sac. Transvaginal technique was performed to assess early pregnancy. COMPARISON:  None for this pregnancy FINDINGS: Intrauterine gestational sac: Single intrauterine gestational sac Yolk sac:  Seen Embryo:  Present Cardiac Activity: Detected Heart Rate: 112  bpm CRL:  7  mm   6 w   4 d                  US EDC: 12/22/2015 Subchorionic hemorrhage:  Small subchorionic hemorrhage noted. Maternal uterus/adnexae: The maternal ovaries appear unremarkable. The right ovary measures 3.2 x 1.9 x 3.1 cm and the left ovary measures 3.1 x 3.0 x 2.6 cm. A corpus luteum may be present within the left ovary. No free fluid identified within the pelvis. IMPRESSION: Single live intrauterine pregnancy with an estimated gestational age of [redacted] weeks, 4 days. Small subchorionic hemorrhage. Electronically Signed   By: Elgie CollardArash  Radparvar M.D.   On: 05/01/2016 19:56   [place] Ob Transvaginal  05/01/2016  CLINICAL DATA:  35 year old pregnant female with left lower quadrant abdominal pain EXAM: OBSTETRIC <14 WK US AND TRANSVAGINAL OB US TECHNIQUE: Both  transabdominal and transvaginal ultrasound examinations were performed for complete evaluation of the gestation as well as the maternal uterus, adnexal regions, and pelvic cul-de-sac. Transvaginal technique was performed to assess early pregnancy. COMPARISON:  None for this pregnancy FINDINGS: Intrauterine gestational sac: Single intrauterine gestational sac Yolk sac:  Seen Embryo:  Present Cardiac Activity: Detected Heart Rate: 112  bpm CRL:  7  mm   6 w   4 d                  US EDC: 12/22/2015 Subchorionic hemorrhage:  Small subchorionic hemorrhage noted. Maternal uterus/adnexae: The maternal ovaries appear unremarkable. The right ovary measures 3.2 x 1.9 x 3.1 cm and the left ovary measures 3.1 x 3.0 x 2.6 cm. A corpus luteum may be present within the left ovary. No free fluid identified within the pelvis. IMPRESSION: Single live intrauterine pregnancy with an estimated gestational age of [redacted] weeks, 4 days. Small subchorionic hemorrhage. Electronically Signed   By: Elgie CollardArash  Radparvar M.D.   On: 05/01/2016 19:56    MDM  Patient with elevated d-dimer. I've ordered a CT angiogram of chest to check for pulmonary embolism given pleuritic chest pain shortness breath, palpitations . I have explained the risk of radiation to the fetus versus risk of undetected pulmonary embolism. She agrees to starting understands those concepts Final diagnoses:  None   Doubt acute coronary syndrome in this young healthy female. Pulmonary embolism has been ruled out by CT angiogram. Plan referral [facility] for outpatient cardiology referral. Diagnosis #1 atypical chest pain #2 palpitations     Doug SouSam Hamad Whyte, MD 05/05/16 2249

## 2016-05-05 NOTE — ED Notes (Signed)
Pt c/o sudden onset L chest discomfort and dizziness while sitting at lunch about 30 minutes ago. Pt is [redacted] weeks pregnant. Pt A&Ox4 and ambulatory. Denies N/V. Denies radiation of pain.

## 2016-05-15 NOTE — Telephone Encounter (Signed)
Patient was seen here at the office on 05/02/16. Pregnancy test was positive. Rx for prenatal vitamins sent. Gynecology Referral to Dr. Huel CoteKathy [name].  Referral faxed over to [phone] with copy of pregnancy test and insurance info. Doctor's office to contact patient with appointment

## 2016-06-21 ENCOUNTER — Emergency Department (HOSPITAL_COMMUNITY)
Admission: EM | Admit: 2016-06-21 | Discharge: 2016-06-21 | Disposition: A | Discharge status: Home / Self Care | Payer: Medicaid Other | Attending: Emergency Medicine | Admitting: Emergency Medicine

## 2016-06-21 ENCOUNTER — Encounter (HOSPITAL_COMMUNITY): Payer: Self-pay | Admitting: Emergency Medicine

## 2016-06-21 DIAGNOSIS — Y92009 Unspecified place in unspecified non-institutional (private) residence as the place of occurrence of the external cause: Secondary | ICD-10-CM | POA: Diagnosis not present

## 2016-06-21 DIAGNOSIS — Z79899 Other long term (current) drug therapy: Secondary | ICD-10-CM | POA: Diagnosis not present

## 2016-06-21 DIAGNOSIS — S0083XA Contusion of other part of head, initial encounter: Secondary | ICD-10-CM

## 2016-06-21 DIAGNOSIS — S0591XA Unspecified injury of right eye and orbit, initial encounter: Secondary | ICD-10-CM | POA: Diagnosis present

## 2016-06-21 DIAGNOSIS — Y9389 Activity, other specified: Secondary | ICD-10-CM | POA: Insufficient documentation

## 2016-06-21 DIAGNOSIS — W228XXA Striking against or struck by other objects, initial encounter: Secondary | ICD-10-CM | POA: Insufficient documentation

## 2016-06-21 DIAGNOSIS — S01111A Laceration without foreign body of right eyelid and periocular area, initial encounter: Secondary | ICD-10-CM | POA: Diagnosis not present

## 2016-06-21 DIAGNOSIS — Y999 Unspecified external cause status: Secondary | ICD-10-CM | POA: Diagnosis not present

## 2016-06-21 MED ORDER — LIDOCAINE-EPINEPHRINE 2 %-1:100000 IJ SOLN
20.0000 mL | Freq: Once | INTRAMUSCULAR | Status: AC
  Administered 2016-06-21: 20 mL
  Filled 2016-06-21: qty 1

## 2016-06-21 MED ORDER — IBUPROFEN 800 MG PO TABS
800.0000 mg | ORAL_TABLET | Freq: Once | ORAL | Status: AC
  Administered 2016-06-21: 800 mg via ORAL
  Filled 2016-06-21: qty 1

## 2016-06-21 NOTE — ED Provider Notes (Signed)
CSN: [id number]     Arrival date & time 06/21/16  0208 History   First MD Initiated Contact with Patient 06/21/16 0404     Chief Complaint  Patient presents with  . Facial Laceration     (Consider location/radiation/quality/duration/timing/severity/associated sxs/prior Treatment) Patient is a 35 y.o. female presenting with skin laceration. The history is provided by the patient. No language interpreter was used.  Laceration Location:  Face Facial laceration location:  R eyebrow Length (cm):  1.5 Depth:  Through dermis Quality: straight   Bleeding: controlled   Time since incident:  3 hours Laceration mechanism:  Unable to specify Pain details:    Quality:  Aching   Severity:  Mild   Timing:  Constant   Progression:  Unchanged Foreign body present:  No foreign bodies Relieved by:  Nothing Ineffective treatments:  None tried Tetanus status:  Up to date   Past Medical History  Diagnosis Date  . No pertinent past medical history   . Cellulitis of calf   . Headache(784.0)     Muscle tension headaches.  Marland Kitchen. UTI (lower urinary tract infection)   . Edema of left foot     onset 9 years   Past Surgical History  Procedure Laterality Date  . Hand surgery  2006    ORIF of left index and middle finger distal MC bones  . Wisdom tooth extraction  High School    All 4  . Leep  2004   Family History  Problem Relation Age of Onset  . Adopted: Yes  . Blindness Maternal Aunt     Not sure if Glaucoma   Social History  Substance Use Topics  . Smoking status: Never Smoker   . Smokeless tobacco: Never Used  . Alcohol Use: Yes     Comment: Social, occas.   OB History    Gravida Para Term Preterm AB TAB SAB Ectopic Multiple Living   3 2 2       2       Review of Systems  Eyes: Negative for visual disturbance.  Gastrointestinal: Negative for nausea and vomiting.  Skin: Positive for wound.  Neurological: Negative for syncope.  All other systems reviewed and are  negative.   Allergies  Codeine and Mushroom extract complex  Home Medications   Prior to Admission medications   Medication Sig Start Date End Date Taking? Authorizing Provider  MICROGESTIN FE 1/20 1-20 MG-MCG tablet Take 1 tablet by mouth daily. 05/26/16   Historical Provider, MD   BP 113/61 mmHg  Pulse 66  Temp(Src) 97.8 F (36.6 C) (Oral)  Resp 20  Ht 5\' 8"  (1.727 m)  Wt 121.564 kg  BMI 40.76 kg/m2  SpO2 99%  LMP 03/15/2016   Physical Exam  Constitutional: She is oriented to person, place, and time. She appears well-developed and well-nourished. No distress.  Nontoxic appearing, in no distress.  HENT:  Head: Normocephalic. Head is with contusion and with laceration. Head is without raccoon's eyes and without Battle's sign.    Eyes: Conjunctivae and EOM are normal. Pupils are equal, round, and reactive to light. No scleral icterus.  1.5 cm laceration noted lateral to the right eyebrow. Bleeding controlled.  Neck: Normal range of motion.  Pulmonary/Chest: Effort normal. No respiratory distress.  Musculoskeletal: Normal range of motion.  Neurological: She is alert and oriented to person, place, and time. No cranial nerve deficit. She exhibits normal muscle tone. Coordination normal.  GCS 15. No focal neurologic deficits appreciated.  Skin: Skin is  warm and dry. No rash noted. She is not diaphoretic. No erythema. No pallor.  Psychiatric: She has a normal mood and affect. Her behavior is normal.  Nursing note and vitals reviewed.   ED Course  Procedures (including critical care time) Labs Review Labs Reviewed - No data to display  Imaging Review No results found.   I have personally reviewed and evaluated these images and lab results as part of my medical decision-making.   EKG Interpretation None      LACERATION REPAIR Performed by: Antony MaduraHUMES, Jarius Dieudonne Authorized by: Antony MaduraHUMES, Zakayla Martinec Consent: Verbal consent obtained. Risks and benefits: risks, benefits and alternatives  were discussed Consent given by: patient Patient identity confirmed: provided demographic data Prepped and Draped in normal sterile fashion Wound explored  Laceration Location: lateral to right eyebrow  Laceration Length: 1.5cm  No Foreign Bodies seen or palpated  Anesthesia: local infiltration  Local anesthetic: lidocaine 2% with epinephrine  Anesthetic total: 2 ml  Irrigation method: syringe Amount of cleaning: standard  Skin closure: 4-0 vicryl  Number of sutures: 2  Technique: simple interrupted  Patient tolerance: Patient tolerated the procedure well with no immediate complications.  MDM   Final diagnoses:  Laceration of eyebrow, right, initial encounter  Facial contusion, initial encounter    Tdap UTD. Laceration occurred < 8 hours prior to repair which was well tolerated. Pt has no comorbidities to effect normal wound healing. Discussed suture home care with pt and answered questions. Pt to follow up with PCP for wound recheck PRN. Pt is hemodynamically stable with no complaints prior to discharge.     Filed Vitals:   06/21/16 0225 06/21/16 0516  BP: 117/82 113/61  Pulse: 82 66  Temp: 97.8 F (36.6 C)   TempSrc: Oral   Resp: 20 20  Height: 5\' 8"  (1.727 m)   Weight: 121.564 kg   SpO2: 100% 99%     Antony MaduraKelly Keltie Labell, PA-C 06/21/16 0535  Rolland PorterMark James, MD 07/04/16 1407
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
## 2016-06-21 NOTE — Discharge Instructions (Signed)
Facial Laceration ° A facial laceration is a cut on the face. These injuries can be painful and cause bleeding. Lacerations usually heal quickly, but they need special care to reduce scarring. °DIAGNOSIS  °Your health care provider will take a medical history, ask for details about how the injury occurred, and examine the wound to determine how deep the cut is. °TREATMENT  °Some facial lacerations may not require closure. Others may not be able to be closed because of an increased risk of infection. The risk of infection and the chance for successful closure will depend on various factors, including the amount of time since the injury occurred. °The wound may be cleaned to help prevent infection. If closure is appropriate, pain medicines may be given if needed. Your health care provider will use stitches (sutures), wound glue (adhesive), or skin adhesive strips to repair the laceration. These tools bring the skin edges together to allow for faster healing and a better cosmetic outcome. If needed, you may also be given a tetanus shot. °HOME CARE INSTRUCTIONS °· Only take over-the-counter or prescription medicines as directed by your health care provider. °· Follow your health care provider's instructions for wound care. These instructions will vary depending on the technique used for closing the wound. °For Sutures: °· Keep the wound clean and dry.   °· If you were given a bandage (dressing), you should change it at least once a day. Also change the dressing if it becomes wet or dirty, or as directed by your health care provider.   °· Wash the wound with soap and water 2 times a day. Rinse the wound off with water to remove all soap. Pat the wound dry with a clean towel.   °· After cleaning, apply a thin layer of the antibiotic ointment recommended by your health care provider. This will help prevent infection and keep the dressing from sticking.   °· You may shower as usual after the first 24 hours. Do not soak the  wound in water until the sutures are removed.   °· Get your sutures removed as directed by your health care provider. With facial lacerations, sutures should usually be taken out after 4-5 days to avoid stitch marks.   °· Wait a few days after your sutures are removed before applying any makeup. °For Skin Adhesive Strips: °· Keep the wound clean and dry.   °· Do not get the skin adhesive strips wet. You may bathe carefully, using caution to keep the wound dry.   °· If the wound gets wet, pat it dry with a clean towel.   °· Skin adhesive strips will fall off on their own. You may trim the strips as the wound heals. Do not remove skin adhesive strips that are still stuck to the wound. They will fall off in time.   °For Wound Adhesive: °· You may briefly wet your wound in the shower or bath. Do not soak or scrub the wound. Do not swim. Avoid periods of heavy sweating until the skin adhesive has fallen off on its own. After showering or bathing, gently pat the wound dry with a clean towel.   °· Do not apply liquid medicine, cream medicine, ointment medicine, or makeup to your wound while the skin adhesive is in place. This may loosen the film before your wound is healed.   °· If a dressing is placed over the wound, be careful not to apply tape directly over the skin adhesive. This may cause the adhesive to be pulled off before the wound is healed.   °· Avoid   prolonged exposure to sunlight or tanning lamps while the skin adhesive is in place. °· The skin adhesive will usually remain in place for 5-10 days, then naturally fall off the skin. Do not pick at the adhesive film.   °After Healing: °Once the wound has healed, cover the wound with sunscreen during the day for 1 full year. This can help minimize scarring. Exposure to ultraviolet light in the first year will darken the scar. It can take 1-2 years for the scar to lose its redness and to heal completely.  °SEEK MEDICAL CARE IF: °· You have a fever. °SEEK IMMEDIATE  MEDICAL CARE IF: °· You have redness, pain, or swelling around the wound.   °· You see a yellowish-white fluid (pus) coming from the wound.   °  °This information is not intended to replace advice given to you by your health care provider. Make sure you discuss any questions you have with your health care provider. °  °Document Released: 01/15/2005 Document Revised: 12/29/2014 Document Reviewed: 07/21/2013 °Elsevier Interactive Patient Education ©2016 [employer] ° °

## 2016-10-20 ENCOUNTER — Telehealth: Payer: Self-pay | Admitting: Internal Medicine

## 2016-10-20 NOTE — Telephone Encounter (Signed)
Patient needs Rx refill for furosemide (Lasix) 40 mg tab. And potassium chloride (K-Dur) 10 mg tablet.  Patient can be reached [phone].

## 2016-10-30 MED ORDER — FUROSEMIDE 40 MG PO TABS: 40.0000 mg | ORAL_TABLET | Freq: Every day | ORAL | 2 refills | Status: DC | PRN

## 2016-10-30 MED ORDER — POTASSIUM CHLORIDE ER 10 MEQ PO TBCR: 10.0000 meq | EXTENDED_RELEASE_TABLET | Freq: Every day | ORAL | 2 refills | Status: DC | PRN

## 2016-10-30 NOTE — Telephone Encounter (Signed)
[facility] [phone]

## 2016-11-25 ENCOUNTER — Ambulatory Visit (INDEPENDENT_AMBULATORY_CARE_PROVIDER_SITE_OTHER): Payer: Medicaid Other | Admitting: Internal Medicine

## 2016-11-25 ENCOUNTER — Encounter: Payer: Self-pay | Admitting: Internal Medicine

## 2016-11-25 VITALS — BP 118/72 | HR 74 | Resp 14 | Ht 67.0 in | Wt 269.0 lb

## 2016-11-25 DIAGNOSIS — F410 Panic disorder [episodic paroxysmal anxiety] without agoraphobia: Secondary | ICD-10-CM | POA: Diagnosis not present

## 2016-11-25 DIAGNOSIS — R002 Palpitations: Secondary | ICD-10-CM | POA: Diagnosis not present

## 2016-11-25 DIAGNOSIS — R0789 Other chest pain: Secondary | ICD-10-CM

## 2016-11-25 NOTE — Patient Instructions (Signed)
Call for worsening symptoms

## 2016-11-25 NOTE — Progress Notes (Signed)
   Subjective:    Patient ID: Cindy [name], female    DOB: 1981/09/10, 35 y.o.   MRN: [id number]  HPI  Palpitations and upper left sternal chest pain.  Palpitations and CP occurred first  in May a few days after finding out she was pregnant. Patient was seen in ED on 5/15 for this.  She was still having sense of palpitations, shortness of breath and high left sternal chest pain at the time of the EKG in ED.  Her HR was 96-97.  Her D dimer was a bit high though CT angio of chest was negative for PE. CBC and BMP were normal.  No TSH in labs Left sternal chest pain is like a very small area of pressure in her chest--like just two fingers pressing against her chest. Had not had any episodes of this since May until the 3 episodes in past week.  States she only gets pain with palpitations and vice versa.   Her symptoms generally last 2-3 hours.  Patient relates she ended her pregnancy when she and her long term female partner decided this was not the right time for them to have a child.   They moved into separate living spaces about 1 month ago. This move is a relief for her in many ways, but a change after 4 years with her partner. She moved back to [place] where she grew up about 1/2 week ago with her son and daughter.  Her daughter will be graduating in January and likely moving on.   She does feel she internalizes anxiety.  Current Meds  Medication Sig  . PARAGARD INTRAUTERINE COPPER IUD IUD 1 each by Intrauterine route once.    Allergies  Allergen Reactions  . Codeine Other (See Comments)    bad headache  . Mushroom Extract Complex Other (See Comments)    Itching of tongue and eyes              Review of Systems     Objective:   Physical Exam NAD Neck:  No thyromegaly or thyroid mass Lungs:  CTA CV:  RRR without murmur or rub, radial and  DP pulses normal and equal.  Point tenderness that reproduces chest pain left sternal border at abour 4th and 5th rib  joint Abd:  S, NT, No HSM or mass, + BS LE:  No edema Neuro:  No tremor, 2+/4 ankle reflexes        Assessment & Plan:  1.  Chest wall pain:  As needed Ibuprofen or Aleve  2.  Likely panic disorder:  Would prefer counseling and no medication for now.  Referral to LCSW, however, check TSH with sense of palpitations.

## 2016-11-26 LAB — TSH: TSH: 1.52 u[IU]/mL (ref 0.450–4.500)

## 2016-11-26 NOTE — Progress Notes (Signed)
Left detailed message for patient on voicemail. Lab was normal.

## 2017-02-17 NOTE — Addendum Note (Signed)
Addended by: Nilda SimmerKNIGHT, Reonna Finlayson on: 02/17/2017 03:40 PM   Modules accepted: Level of Service

## 2017-07-13 ENCOUNTER — Encounter: Payer: Medicaid Other | Attending: Specialist | Admitting: Registered"

## 2017-07-13 ENCOUNTER — Encounter: Payer: Self-pay | Admitting: Registered"

## 2017-07-13 DIAGNOSIS — Z713 Dietary counseling and surveillance: Secondary | ICD-10-CM | POA: Insufficient documentation

## 2017-07-13 NOTE — Progress Notes (Signed)
Medical Nutrition Therapy:  Appt start time: 0950 end time:  1105.   Assessment:  Primary concerns today: Pt referred for obesity. Pt says she would like help with following a low sodium diet. Pt says she is on Lasix to help with fluid retention. Pt says she started taking Adderall in January 2018 because she has struggled with becoming distracted and overeating. Pt says she has lost ~30 lbs since that time, but she is unsure if it is due to tissue or water loss. She says she does not usually take her Adderall on the weekends. Pt says Adderall makes her not want to eat more than 1-2 meals per day and sometimes getting in that amount is difficult. Pt says she has a vitamin D deficiency and would like to discuss sources of vitamin D. Pt says her nails grow well, but she feels they break off easily and she feels her hair has become thinner recently. Pt also says she is often very tired.   Preferred Learning Style:  No preference indicated   Learning Readiness:   Ready  MEDICATIONS: See list.    DIETARY INTAKE:  Usual eating pattern includes 1-2 meals and maybe 1 snack per day. Pt says she refills a Yeti water bottle around 6 times per day when she is working because her mouth becomes very dry from her Adderall medication. Pt says she knows she drinks more water than she needs.   Everyday foods: none reported.  Avoided foods include lima beans, black eyed peas, bread, potatoes.    24-hr recall:  B ( 8 AM): banana, water  Snk ( AM): None reported.  L ( PM):  None reported.  Snk ( 2:30 PM): peach Snk (PM): Yemenuna packet   D (7 PM): goat meat stew with potatoes, carrots, onions, beef stock Snk ( PM): None reported.  Beverages: water  Pt says her 24 hour recall provided includes more food than she often eats.   Usual physical activity: takes walks on lunch break, takes stairs at works, sometimes goes swimming  Progress Towards Goal(s):  In progress.   Nutritional Diagnosis:  NI-5.11.1  Predicted suboptimal nutrient intake As related to skipping meals and unbalanced diet with inadequate amount of carbohydrates and protein.  As evidenced by pt's reported diet recall.    Intervention:  Nutrition counseling provided. Dietitian provided education on eating balanced meals and discussed the importance of focusing on overall health and balanced nutrition rather than on weight. Dietitian discussed the importance of eating regular/consistent meals to provide the body with the nutrients it needs and promote healthy metabolism. Dietitian discussed with pt that thinning hair and nails can be caused by a nutrient deficiency and that pt's current dietary intake is not adequate to provide the calories or nutrients her body needs. Dietitian encouraged pt to try to get in three meals per day and if unable to eat a meal she can drink a meal replacement drink instead to prevent skipping, however, whole food is preferred. Dietitian recommended pt start taking a multi-vitamin and that she ask her doctor about having vitamin D level assessed. Dietitian provided low sodium nutrition therapy education and discussed how to assess sodium in foods by looking at the nutrition label. Pt appeared agreeable to information and goals discussed.   Goals:  Make sure to get in three meals per day to provide adequate nutrition for your body and promote healthy metabolism. Can have a snack in between meals if you get hungry.Try to have balanced meals  like the plate example to provide your body with the nutrients it needs.  Try to be mindful of sodium content in foods by reading labels/nutrition information (see handout for additional information).   Calorieking.com-Can use to look up sodium content in foods at restaurants and stores.  Recommend taking a multi-vitamin.    Teaching Method Utilized: Visual Auditory  Handouts given during visit include:  Balanced plate and list of foods  Low Sodium Nutrition Therapy  and Example food label  Barriers to learning/adherence to lifestyle change: None indicated.   Demonstrated degree of understanding via:  Teach Back   Monitoring/Evaluation:  Dietary intake, exercise, and body weight in 1 month(s).

## 2017-07-13 NOTE — Patient Instructions (Addendum)
Make sure to get in three meals per day to provide adequate nutrition for your body and promote healthy metabolism. Can have a snack in between meals if you get hungry. Try to have balanced meals like the plate example to provide your body with the nutrients it needs.  Try to be mindful of sodium content in foods by reading labels/nutrition information (see handout for additional information).   Calorieking.com-Can use to look up sodium content in foods at restaurants and stores.  Recommend taking a multi-vitamin.

## 2017-08-13 ENCOUNTER — Ambulatory Visit: Payer: Medicaid Other | Admitting: Registered"

## 2018-02-19 ENCOUNTER — Ambulatory Visit: Payer: Self-pay | Admitting: Family Medicine

## 2018-02-22 ENCOUNTER — Encounter: Payer: Self-pay | Admitting: Family Medicine

## 2018-02-22 ENCOUNTER — Ambulatory Visit (INDEPENDENT_AMBULATORY_CARE_PROVIDER_SITE_OTHER): Payer: 59 | Admitting: Family Medicine

## 2018-02-22 ENCOUNTER — Ambulatory Visit: Payer: Self-pay | Admitting: Family Medicine

## 2018-02-22 VITALS — BP 100/70 | HR 81 | Temp 97.7°F | Ht 68.75 in | Wt 230.0 lb

## 2018-02-22 DIAGNOSIS — F900 Attention-deficit hyperactivity disorder, predominantly inattentive type: Secondary | ICD-10-CM

## 2018-02-22 DIAGNOSIS — R609 Edema, unspecified: Secondary | ICD-10-CM

## 2018-02-22 DIAGNOSIS — Z0000 Encounter for general adult medical examination without abnormal findings: Secondary | ICD-10-CM

## 2018-02-22 DIAGNOSIS — Z131 Encounter for screening for diabetes mellitus: Secondary | ICD-10-CM

## 2018-02-22 DIAGNOSIS — Z13220 Encounter for screening for lipoid disorders: Secondary | ICD-10-CM

## 2018-02-22 MED ORDER — AMPHETAMINE-DEXTROAMPHETAMINE 10 MG PO TABS: ORAL_TABLET | ORAL | 0 refills | Status: DC

## 2018-02-22 MED ORDER — AMPHETAMINE-DEXTROAMPHET ER 30 MG PO CP24
30.0000 mg | ORAL_CAPSULE | ORAL | 0 refills | Status: DC
Start: 2018-02-22 — End: 2018-06-18

## 2018-02-22 MED ORDER — AMPHETAMINE-DEXTROAMPHET ER 30 MG PO CP24: 30.0000 mg | ORAL_CAPSULE | ORAL | 0 refills | Status: DC

## 2018-02-22 NOTE — Progress Notes (Signed)
Patient presents to clinic today to establish care.  SUBJECTIVE: PMH: Patient is a 37 year old female with past medical history significant for ADHD, fluid retention.  [facility]  ADHD: -Pt endorses difficulty with completing task. -She has been on Adderall ER 30 mg in the morning and Adderall ER 10 mg around 2:00 PM -Pt endorses difficulty sleeping last night -Pt denies issues with blood pressure or changes in appetite.  Fluid retention: -Pt endorses noticing after the birth of her child in 2005 -Mostly in lower extremities but will also noticed in hands -Pt denies shortness of breath -Pt has limited sodium intake. -Pt does endorse prolonged sitting at work -Patient was given Lasix 40 mg to take as needed -Patient states she is never had this worked up -Patient also notices bruising on her ankles x the last 2 months  Allergies: Codeine-headache Mushrooms-inner ear itching, face itching, blotchy skin  Past surgical history: 2 pins in left second and third digits after breaking bones 2/2 tripping and falling  Social history: Patient is single.  She has 2 children.  She currently works in Community education officerinsurance.  Patient endorses social alcohol use.  Patient denies tobacco use.  Patient endorses marijuana use approximately twice a week since the age of 37.  Family medical history: Patient is adopted  Health Maintenance: Dental --Dr. Cynda AcresMeit H. See Vision --[facility] Alet[facility]My [facility] P[facility] --03/2017    Past Medical History:  Diagnosis Date  . Cellulitis of calf   . Chicken pox   . Depression   . Edema of left foot    onset 9 years  . Generalized headaches   . Headache(784.0)    Muscle tension headaches.  . Migraines   . No pertinent past medical history   . UTI (lower urinary tract infection)   . UTI (urinary tract infection)     Past Surgical History:  Procedure Laterality Date  . HAND SURGERY  2006   ORIF of left index and  middle finger distal MC bones  . LEEP  2004  . LEEP  2000  . pins in hand Left 2003  . WISDOM TOOTH EXTRACTION  High School   All 4    Current Outpatient Medications on File Prior to Visit  Medication Sig Dispense Refill  . amphetamine-dextroamphetamine (ADDERALL XR) 10 MG 24 hr capsule Take 10 mg by mouth daily.    Marland Kitchen. amphetamine-dextroamphetamine (ADDERALL) 30 MG tablet Take 30 mg by mouth daily.    . Cholecalciferol (VITAMIN D3 PO) Take 75 mg by mouth daily.    . Cyanocobalamin 1000 MCG/ML LIQD Take by mouth.    . furosemide (LASIX) 20 MG tablet Take 20 mg by mouth.    . furosemide (LASIX) 40 MG tablet furosemide 40 mg tablet    . Magnesium Citrate 200 MG TABS Take 250 mg by mouth daily.    Marland Kitchen. MICROGESTIN FE 1/20 1-20 MG-MCG tablet Take 1 tablet by mouth daily.  3  . PARAGARD INTRAUTERINE COPPER IUD IUD 1 each by Intrauterine route once.    . potassium chloride (K-DUR) 10 MEQ tablet Take 1 tablet (10 mEq total) by mouth daily as needed (for fluid retention with furosemide.). 30 tablet 2   No current facility-administered medications on file prior to visit.     Allergies  Allergen Reactions  . Codeine Other (See Comments)    bad headache  . Mushroom Extract Complex Other (See Comments)    Itching  of tongue and eyes    Family History  Adopted: Yes  Problem Relation Age of Onset  . Blindness Maternal Aunt        Not sure if Glaucoma    Social History   Socioeconomic History  . Marital status: Single    Spouse name: William-boyfriend  . Number of children: 2  . Years of education: 54  . Highest education level: Not on file  Social Needs  . Financial resource strain: Not on file  . Food insecurity - worry: Not on file  . Food insecurity - inability: Not on file  . Transportation needs - medical: Not on file  . Transportation needs - non-medical: Not on file  Occupational History  . Not on file  Tobacco Use  . Smoking status: Never Smoker  . Smokeless tobacco:  Never Used  Substance and Sexual Activity  . Alcohol use: Yes    Comment: Social, occas.  . Drug use: Yes    Types: Marijuana  . Sexual activity: Yes    Birth control/protection: None    Comment: IUD caused weight gain.  OCPs caused fluid retention, Nuvaring not interested in.  Never tried a diaphragm.  Other Topics Concern  . Not on file  Social History Narrative   Lives in [place] with boyfriend    Boyfriend is Bipolar and possibly carried diagnosis of Schizophrenia--working on his meds right now.   Could use some help in knowing how to deal with his ups and downs.   69 yo son at [place] and [address] in [place]   Originally from [place]   Graduated from [employer]: Denies fever, chills, night sweats, changes in weight, changes in appetite HEENT: Denies headaches, ear pain, changes in vision, rhinorrhea, sore throat CV: Denies CP, palpitations, SOB, orthopnea Pulm: Denies SOB, cough, wheezing GI: Denies abdominal pain, nausea, vomiting, diarrhea, constipation GU: Denies dysuria, hematuria, frequency, vaginal discharge Msk: Denies muscle cramps, joint pains Neuro: Denies weakness, numbness, tingling Skin: Denies rashes    + bruising, edema Psych: Denies depression, anxiety, hallucinations  +ADHD  BP 100/70 (BP Location: Right Arm, Patient Position: Sitting, Cuff Size: Large)   Pulse 81   Temp 97.7 F (36.5 C) (Oral)   Ht 5' 8.75" (1.746 m)   Wt 230 lb (104.3 kg)   LMP 02/15/2018   SpO2 97%   BMI 34.21 kg/m   Physical Exam Gen. Pleasant, well developed, well-nourished, in NAD HEENT - [place]/AT, PERRL,  no scleral icterus, no nasal drainage, pharynx without erythema or exudate. Neck: No JVD, no thyromegaly, no carotid bruits Lungs: no use of accessory muscles, CTAB, no wheezes, rales or rhonchi Cardiovascular: RRR, No r/g/m, peripheral edema noted Abdomen: BS present, soft, nontender,nondistended, no hepatosplenomegaly.  Palpable nontender  lymph node in the right upper quadrant. Musculoskeletal: No deformities, moves all four extremities, no cyanosis or clubbing, normal tone Neuro:  A&Ox3, CN II-XII intact, normal gait Skin:  Warm, dry, intact, no lesions Psych: normal affect, mood appropriate  No results found for this or any previous visit (from the past 2160 hour(s)).  Assessment/Plan: Well adult exam  -Anticipatory guidance given including wearing seatbelts, smoke detectors in the home, increasing physical activity, increasing p.o. intake of water, increasing p.o. intake of vegetables, decreasing intake of sodium. -Patient given handout -Pap up-to-date -We will obtain labs.  Next CPE in 1 year - Plan: CBC with Differential/Platelet, Comprehensive metabolic panel  Attention deficit hyperactivity disorder (ADHD), predominantly inattentive type -We  will give refills on Adderall XR 30 mg to be taken in the a.m. -Patient is having difficulty sleeping suggested changing p.m. dose of Adderall from extended release to immediate release.  We will send an Rx for Adderall 10 mg not to be taken after 1 PM.  - Plan: amphetamine-dextroamphetamine (ADDERALL XR) 30 MG 24 hr capsule, amphetamine-dextroamphetamine (ADDERALL XR) 30 MG 24 hr capsule, amphetamine-dextroamphetamine (ADDERALL XR) 30 MG 24 hr capsule, amphetamine-dextroamphetamine (ADDERALL) 10 MG tablet  Fluid retention  -We will obtain labs and urine - Plan: Comprehensive metabolic panel, Protein / Creatinine Ratio, Urine  Screening for cholesterol level  - Plan: Lipid panel  Screening for diabetes mellitus  - Plan: Hemoglobin A1c  Follow-up in 1 month.  Sooner if needed  Abbe Amsterdam, MD

## 2018-02-22 NOTE — Patient Instructions (Addendum)
The lab in the clinic is open each week day from 7:30 AM to 5:30 PM. Preventive Care 18-39 Years, Female Preventive care refers to lifestyle choices and visits with your health care provider that can promote health and wellness. What does preventive care include?  A yearly physical exam. This is also called an annual well check.  Dental exams once or twice a year.  Routine eye exams. Ask your health care provider how often you should have your eyes checked.  Personal lifestyle choices, including: ? Daily care of your teeth and gums. ? Regular physical activity. ? Eating a healthy diet. ? Avoiding tobacco and drug use. ? Limiting alcohol use. ? Practicing safe sex. ? Taking vitamin and mineral supplements as recommended by your health care provider. What happens during an annual well check? The services and screenings done by your health care provider during your annual well check will depend on your age, overall health, lifestyle risk factors, and family history of disease. Counseling Your health care provider may ask you questions about your:  Alcohol use.  Tobacco use.  Drug use.  Emotional well-being.  Home and relationship well-being.  Sexual activity.  Eating habits.  Work and work Statistician.  Method of birth control.  Menstrual cycle.  Pregnancy history.  Screening You may have the following tests or measurements:  Height, weight, and BMI.  Diabetes screening. This is done by checking your blood sugar (glucose) after you have not eaten for a while (fasting).  Blood pressure.  Lipid and cholesterol levels. These may be checked every 5 years starting at age 47.  Skin check.  Hepatitis C blood test.  Hepatitis B blood test.  Sexually transmitted disease (STD) testing.  BRCA-related cancer screening. This may be done if you have a family history of breast, ovarian, tubal, or peritoneal cancers.  Pelvic exam and Pap test. This may be done every 3  years starting at age 35. Starting at age 14, this may be done every 5 years if you have a Pap test in combination with an HPV test.  Discuss your test results, treatment options, and if necessary, the need for more tests with your health care provider. Vaccines Your health care provider may recommend certain vaccines, such as:  Influenza vaccine. This is recommended every year.  Tetanus, diphtheria, and acellular pertussis (Tdap, Td) vaccine. You may need a Td booster every 10 years.  Varicella vaccine. You may need this if you have not been vaccinated.  HPV vaccine. If you are 34 or younger, you may need three doses over 6 months.  Measles, mumps, and rubella (MMR) vaccine. You may need at least one dose of MMR. You may also need a second dose.  Pneumococcal 13-valent conjugate (PCV13) vaccine. You may need this if you have certain conditions and were not previously vaccinated.  Pneumococcal polysaccharide (PPSV23) vaccine. You may need one or two doses if you smoke cigarettes or if you have certain conditions.  Meningococcal vaccine. One dose is recommended if you are age 40-21 years and a first-year college student living in a residence hall, or if you have one of several medical conditions. You may also need additional booster doses.  Hepatitis A vaccine. You may need this if you have certain conditions or if you travel or work in places where you may be exposed to hepatitis A.  Hepatitis B vaccine. You may need this if you have certain conditions or if you travel or work in places where you may be  exposed to hepatitis B.  Haemophilus influenzae type b (Hib) vaccine. You may need this if you have certain risk factors.  Talk to your health care provider about which screenings and vaccines you need and how often you need them. This information is not intended to replace advice given to you by your health care provider. Make sure you discuss any questions you have with your health care  provider. Document Released: 02/03/2002 Document Revised: 08/27/2016 Document Reviewed: 10/09/2015 Elsevier Interactive Patient Education  2018 [employer].   Peripheral Edema Peripheral edema is swelling that is caused by a buildup of fluid. Peripheral edema most often affects the lower legs, ankles, and feet. It can also develop in the arms, hands, and face. The area of the body that has peripheral edema will look swollen. It may also feel heavy or warm. Your clothes may start to feel tight. Pressing on the area may make a temporary dent in your skin. You may not be able to move your arm or leg as much as usual. There are many causes of peripheral edema. It can be a complication of other diseases, such as congestive heart failure, kidney disease, or a problem with your blood circulation. It also can be a side effect of certain medicines. It often happens to women during pregnancy. Sometimes, the cause is not known. Treating the underlying condition is often the only treatment for peripheral edema. Follow these instructions at home: Pay attention to any changes in your symptoms. Take these actions to help with your discomfort:  Raise (elevate) your legs while you are sitting or lying down.  Move around often to prevent stiffness and to lessen swelling. Do not sit or stand for long periods of time.  Wear support stockings as told by your health care provider.  Follow instructions from your health care provider about limiting salt (sodium) in your diet. Sometimes eating less salt can reduce swelling.  Take over-the-counter and prescription medicines only as told by your health care provider. Your health care provider may prescribe medicine to help your body get rid of excess water (diuretic).  Keep all follow-up visits as told by your health care provider. This is important.  Contact a health care provider if:  You have a fever.  Your edema starts suddenly or is getting worse, especially  if you are pregnant or have a medical condition.  You have swelling in only one leg.  You have increased swelling and pain in your legs. Get help right away if:  You develop shortness of breath, especially when you are lying down.  You have pain in your chest or abdomen.  You feel weak.  You faint. This information is not intended to replace advice given to you by your health care provider. Make sure you discuss any questions you have with your health care provider. Document Released: 01/15/2005 Document Revised: 05/12/2016 Document Reviewed: 06/20/2015 Elsevier Interactive Patient Education  2018 [place] With Attention Deficit Hyperactivity Disorder If you have been diagnosed with attention deficit hyperactivity disorder (ADHD), you may be relieved that you now know why you have felt or behaved a certain way. Still, you may feel overwhelmed about the treatment ahead. You may also wonder how to get the support you need and how to deal with the condition day-to-day. With treatment and support, you can live with ADHD and manage your symptoms. How to manage lifestyle changes Managing stress Stress is your body's reaction to life changes and events, both good and  bad. To cope with the stress of an ADHD diagnosis, it may help to:  Learn more about ADHD.  Exercise regularly. Even a short daily walk can lower stress levels.  Participate in training or education programs (including social skills training classes) that teach you to deal with symptoms.  Medicines Your health care provider may suggest certain medicines if he or she feels that they will help to improve your condition. Stimulant medicines are usually prescribed to treat ADHD, and therapy may also be prescribed. It is important to:  Avoid using alcohol and other substances that may prevent your medicines from working properly [facility]).  Talk with your pharmacist or health care provider about all the  medicines that you take, their possible side effects, and what medicines are safe to take together.  Make it your goal to take part in all treatment decisions (shared decision-making). Ask about possible side effects of medicines that your health care provider recommends, and tell him or her how you feel about having those side effects. It is best if shared decision-making with your health care provider is part of your total treatment plan.  Relationships To strengthen your relationships with family members while treating your condition, consider taking part in family therapy. You might also attend self-help groups alone or with a loved one. Be honest about how your symptoms affect your relationships. Make an effort to communicate respectfully instead of fighting, and find ways to show others that you care. Psychotherapy may be useful in helping you cope with how ADHD affects your relationships. How to recognize changes in your condition The following signs may mean that your treatment is working well and your condition is improving:  Consistently being on time for appointments.  Being more organized at home and work.  Other people noticing improvements in your behavior.  Achieving goals that you set for yourself.  Thinking more clearly.  The following signs may mean that your treatment is not working very well:  Feeling impatience or more confusion.  Missing, forgetting, or being late for appointments.  An increasing sense of disorganization and messiness.  More difficulty in reaching goals that you set for yourself.  Loved ones becoming angry or frustrated with you.  Where to find support Talking to others  Keep emotion out of important discussions and speak in a calm, logical way.  Listen closely and patiently to your loved ones. Try to understand their point of view, and try to avoid getting defensive.  Take responsibility for the consequences of your actions.  Ask that  others do not take your behaviors personally.  Aim to solve problems as they come up, and express your feelings instead of bottling them up.  Talk openly about what you need from your loved ones and how they can support you.  Consider going to family therapy sessions or having your family meet with a specialist who deals with ADHD-related behavior problems. Finances Not all insurance plans cover mental health care, so it is important to check with your insurance carrier. If paying for co-pays or counseling services is a problem, search for a local or county mental health care center. Public mental health care services may be offered there at a low cost or no cost when you are not able to see a private health care provider. If you are taking medicine for ADHD, you may be able to get the generic form, which may be less expensive than brand-name medicine. Some makers of prescription medicines also offer help to  patients who cannot afford the medicines that they need. Follow these instructions at home:  Take over-the-counter and prescription medicines only as told by your health care provider. Check with your health care provider before taking any new medicines.  Create structure and an organized atmosphere at home. For example: ? Make a list of tasks, then rank them from most important to least important. Work on one task at a time until your listed tasks are done. ? Make a daily schedule and follow it consistently every day. ? Use an appointment calendar, and check it 2 or 3 times a day to keep on track. Keep it with you when you leave the house. ? Create spaces where you keep certain things, and always put things back in their places after you use them.  Keep all follow-up visits as told by your health care provider. This is important. Questions to ask your health care provider:  What are the risks and benefits of taking medicines?  Would I benefit from therapy?  How often should I follow  up with a health care provider? Contact a health care provider if:  You have side effects from your medicines, such as: ? Repeated muscle twitches, coughing, or speech outbursts. ? Sleep problems. ? Loss of appetite. ? Depression. ? New or worsening behavior problems. ? Dizziness. ? Unusually fast heartbeat. ? Stomach pains. ? Headaches. Get help right away if:  You have a severe reaction to a medicine.  Your behavior suddenly gets worse. Summary  With treatment and support, you can live with ADHD and manage your symptoms.  The medicines that are most often prescribed for ADHD are stimulants.  Consider taking part in family therapy or self-help groups with family members or friends.  When you talk with friends and family about your ADHD, be patient and communicate openly.  Take over-the-counter and prescription medicines only as told by your health care provider. Check with your health care provider before taking any new medicines. This information is not intended to replace advice given to you by your health care provider. Make sure you discuss any questions you have with your health care provider. Document Released: 04/09/2017 Document Revised: 04/09/2017 Document Reviewed: 04/09/2017 Elsevier Interactive Patient Education  [employer].

## 2018-02-24 ENCOUNTER — Other Ambulatory Visit (INDEPENDENT_AMBULATORY_CARE_PROVIDER_SITE_OTHER): Payer: 59

## 2018-02-24 DIAGNOSIS — Z0000 Encounter for general adult medical examination without abnormal findings: Secondary | ICD-10-CM | POA: Diagnosis not present

## 2018-02-24 LAB — COMPREHENSIVE METABOLIC PANEL
ALBUMIN: 3.6 g/dL (ref 3.5–5.2)
ALT: 13 U/L (ref 0–35)
AST: 10 U/L (ref 0–37)
Alkaline Phosphatase: 61 U/L (ref 39–117)
BUN: 9 mg/dL (ref 6–23)
CALCIUM: 9.4 mg/dL (ref 8.4–10.5)
CHLORIDE: 106 meq/L (ref 96–112)
CO2: 28 meq/L (ref 19–32)
Creatinine, Ser: 0.73 mg/dL (ref 0.40–1.20)
GFR: 114.31 mL/min (ref >60.00)
Glucose, Bld: 95 mg/dL (ref 70–99)
POTASSIUM: 5.2 meq/L — AB (ref 3.5–5.1)
Sodium: 140 mEq/L (ref 135–145)
Total Bilirubin: 0.2 mg/dL (ref 0.2–1.2)
Total Protein: 6.3 g/dL (ref 6.0–8.3)

## 2018-02-24 LAB — LIPID PANEL
CHOL/HDL RATIO: 2
CHOLESTEROL: 135 mg/dL (ref 0–200)
HDL: 55 mg/dL (ref >39.00)
LDL CALC: 71 mg/dL (ref 0–99)
NonHDL: 79.95
TRIGLYCERIDES: 43 mg/dL (ref 0.0–149.0)
VLDL: 8.6 mg/dL (ref 0.0–40.0)

## 2018-02-24 LAB — HEMOGLOBIN A1C: Hgb A1c MFr Bld: 5.6 % (ref 4.6–6.5)

## 2018-02-25 ENCOUNTER — Telehealth: Payer: Self-pay | Admitting: Family Medicine

## 2018-02-25 NOTE — Telephone Encounter (Signed)
Please advise? I'm not sure what labs (if any) would be specific to knot in her stomach. WBC and liver function were normal.  Per office note:  Palpable nontender lymph node in the right upper quadrant.

## 2018-02-25 NOTE — Telephone Encounter (Signed)
Copied from CRM [phone]. Topic: Quick Communication - See Telephone Encounter >> Feb 25, 2018 12:45 PM Maia Pettiesrtiz, Kristie S wrote: CRM for notification. See Telephone encounter for: 02/25/18.  Pt called stating she viewed lab results on mychart. She could not see the result notes. I notified her what was there. Pt states that she was waiting on labs regarding the knot in her stomach as well and isn't sure if the labs released checked that. Pt requesting call to discuss.

## 2018-02-25 NOTE — Telephone Encounter (Signed)
Not sure where pt got "labs for knot in stomach".  Please advise her that her CBC was normal and the finding was incidental we will continue to monitor.

## 2018-02-26 ENCOUNTER — Telehealth: Payer: Self-pay | Admitting: Family Medicine

## 2018-02-26 NOTE — Telephone Encounter (Signed)
Pt received prescription for Adderall 30mg  cap, but pt states she is supposed to have Adderall XR 10mg  filled as well.

## 2018-02-26 NOTE — Telephone Encounter (Signed)
Copied from CRM [phone]. Topic: Quick Communication - Rx Refill/Question >> Feb 26, 2018  5:28 PM Floria RavelingStovall, Shana A wrote: Medication:  amphetamine-dextroamphetamine (ADDERALL XR) 10 MG 24 hr capsule [[id number]][[id number]]    Has the patient contacted their pharmacy? No    (Agent: If no, request that the patient contact the pharmacy for the refill.)   Preferred Pharmacy (with phone number or street name): [facility] on [address] .  She rec'd the 30s but she said that she was suppose to have the 10s as well    Agent: Please be advised that RX refills may take up to 3 business days. We ask that you follow-up with your pharmacy.

## 2018-02-26 NOTE — Telephone Encounter (Signed)
Called patient and left message to return call

## 2018-03-01 NOTE — Telephone Encounter (Signed)
Agree with wearing compression stockings.  Can also elevate feet when sitting.  Try to avoid prolonged sitting or standing.  Would also recommend limiting sodium intake.

## 2018-03-01 NOTE — Telephone Encounter (Signed)
Called patient and left message to return call

## 2018-03-01 NOTE — Telephone Encounter (Signed)
No.  Per my last OFV note, patient was having difficulty sleeping at night after taking her second dose of Adderall which is the 10 mg extended release pill.  The 10 mg Adderall was changed to regular release not to be taken after 1 PM to see patients sleep would improve.  She takes the Adderall ER 30 mg pill first thing in the morning.

## 2018-03-01 NOTE — Telephone Encounter (Signed)
Pt notified of results/instructions and verbalized understanding. She would like to know what, if anything, Dr. Salomon FickBanks recommends she do about her fluid retention and LEE. I recommended compression stockings. Please advise if any additional recommendations.

## 2018-03-01 NOTE — Telephone Encounter (Signed)
OK to refilled the extended release medication? Please advise.

## 2018-03-02 NOTE — Telephone Encounter (Signed)
Called patient and left detailed message (per DPR) with information below and instructions to return call if questions.

## 2018-03-04 NOTE — Telephone Encounter (Signed)
Left a detailed message on pt's voicemail  

## 2018-03-25 ENCOUNTER — Ambulatory Visit: Payer: 59 | Admitting: Family Medicine

## 2018-03-25 DIAGNOSIS — Z0289 Encounter for other administrative examinations: Secondary | ICD-10-CM

## 2018-03-26 ENCOUNTER — Ambulatory Visit: Payer: 59 | Admitting: Family Medicine

## 2018-03-26 DIAGNOSIS — Z0289 Encounter for other administrative examinations: Secondary | ICD-10-CM

## 2018-03-30 ENCOUNTER — Other Ambulatory Visit: Payer: Self-pay | Admitting: Family Medicine

## 2018-03-30 DIAGNOSIS — F900 Attention-deficit hyperactivity disorder, predominantly inattentive type: Secondary | ICD-10-CM

## 2018-03-30 NOTE — Telephone Encounter (Signed)
Pt called - she said that pharm said they never got the refills for this medication - amphetamine-dextroamphetamine (ADDERALL XR) 10 MG 24 hr    and amphetamine-dextroamphetamine (ADDERALL) 10 MG tablet

## 2018-03-31 NOTE — Telephone Encounter (Signed)
Pt went to pharm - she did not get the adderrall 10 mg  Pharm is [facility] [address]   [phone]

## 2018-04-01 ENCOUNTER — Other Ambulatory Visit: Payer: Self-pay | Admitting: Family Medicine

## 2018-04-01 DIAGNOSIS — F900 Attention-deficit hyperactivity disorder, predominantly inattentive type: Secondary | ICD-10-CM

## 2018-04-01 MED ORDER — AMPHETAMINE-DEXTROAMPHETAMINE 10 MG PO TABS: ORAL_TABLET | ORAL | 0 refills | Status: DC

## 2018-04-01 NOTE — Telephone Encounter (Signed)
Please advise 

## 2018-04-01 NOTE — Progress Notes (Signed)
3 month supply give for pt's Adderall 10 mg to be taken in the afternoon before 1:00pm.  Pt already has rx's remaining for Adderall 30 mg XR which she takes in the am.

## 2018-04-01 NOTE — Telephone Encounter (Signed)
Per chart review the Adderall ER 30 mg has 2 scripts remaining, that were sent in March to the pharmacy.  Only the Adderall 10 mg not to be taken after 1 pm needs to be refilled.

## 2018-04-01 NOTE — Telephone Encounter (Signed)
Patient requests refills on her Adderal, Please Advise

## 2018-04-01 NOTE — Telephone Encounter (Signed)
°  Pt. Came to see Dr. Salomon FickBanks last month and she was to call in a 10 mg and 30mg  [facility], and [facility]  [facility] with no response. [name] is asking about the prescriptions, if can be sent  And Doctor Salomon FickBanks was going to do 3 months supply  She was to have taken this starting the 4th of April. Needs to be done asap  [phone] [place] - [address] [facility] [address] [phone] (Phone) [phone] (Fax)

## 2018-04-02 NOTE — Telephone Encounter (Signed)
Spoke with Pt and is aware.

## 2018-04-02 NOTE — Telephone Encounter (Signed)
This Rx Refills have been sent to pharmacy already. Spoke with pharmacy and nothing further needed.

## 2018-05-06 ENCOUNTER — Telehealth: Payer: Self-pay | Admitting: Family Medicine

## 2018-05-06 NOTE — Telephone Encounter (Signed)
Copied from CRM [id number]. Topic: Quick Communication - Rx Refill/Question >> May 06, 2018  3:56 PM Percival Spanish wrote: Medication  furosemide (LASIX) 40 MG tablet   Preferred Pharmacy  [facility] [address]   Agent: Please be advised that RX refills may take up to 3 business days. We ask that you follow-up with your pharmacy.

## 2018-05-06 NOTE — Telephone Encounter (Signed)
Request for refill of Lasix 40 mg, last filled by historical provider.   LOV: 02/22/18 Dr. Francesca Oman on [address]

## 2018-05-07 NOTE — Telephone Encounter (Signed)
Okay to refill? Please advise sig as current instructions only say "take by mouth."

## 2018-05-10 MED ORDER — FUROSEMIDE 40 MG PO TABS: 40.0000 mg | ORAL_TABLET | Freq: Every day | ORAL | 2 refills | Status: DC | PRN

## 2018-05-10 NOTE — Telephone Encounter (Signed)
Ok to refill.  # 20 with 2 refills.  Historic med, pt was taking prn for LE edema.

## 2018-05-10 NOTE — Telephone Encounter (Signed)
Medication filled to pharmacy as requested.   

## 2018-06-16 ENCOUNTER — Other Ambulatory Visit: Payer: Self-pay | Admitting: Family Medicine

## 2018-06-16 DIAGNOSIS — F900 Attention-deficit hyperactivity disorder, predominantly inattentive type: Secondary | ICD-10-CM

## 2018-06-17 ENCOUNTER — Ambulatory Visit: Payer: Self-pay | Admitting: *Deleted

## 2018-06-17 NOTE — Telephone Encounter (Signed)
Pt has complaints of right leg pain for the past couple of months. Pt states pain starts from hip down to the side of leg. Pt describes the pain as a aching type of pain that starts from her hip down the side of her leg. Pt states it seems to bother her with driving and when she gets out of the car she has to stretch in order to get the pan to decrease. Pt has not taken any over the counter medications due to taking Adderall.Pt states when she walks she does not feel the pain and also with swimming yesterday she did not feel any pain. Pt states she has swelling to her leg at baseline and her ankle currently leaves an indention when pressed and she has also noticed about 6 bruises that go around her ankle a well. Pt states she has been wearing her circulation socks bur has noticed that the swelling in her leg has increased.  Pt denies any redness to leg but states that she did note that she had a knot that is sore on her shin. Pt denies any shortness of breath, chest pain or other symptoms. Pt scheduled for appt on 6/28 with Dr. Salomon FickBanks. Pt advised that if symptoms became worse before appt to call the office back. Pt verbalized understanding.   Reason for Disposition . [1] MODERATE pain (e.g., interferes with normal activities, limping) AND [2] present > 3 days  Answer Assessment - Initial Assessment Questions 1. ONSET: "When did the pain start?"      A couple of months 2. LOCATION: "Where is the pain located?"      Right leg 3. PAIN: "How bad is the pain?"    (Scale 1-10; or mild, moderate, severe)   -  MILD (1-3): doesn't interfere with normal activities    -  MODERATE (4-7): interferes with normal activities (e.g., work or school) or awakens from sleep, limping    -  SEVERE (8-10): excruciating pain, unable to do any normal activities, unable to walk     6 4. WORK OR EXERCISE: "Has there been any recent work or exercise that involved this part of the body?"      No 5. CAUSE: "What do you think is  causing the leg pain?"     Don't know 6. OTHER SYMPTOMS: "Do you have any other symptoms?" (e.g., chest pain, back pain, breathing difficulty, swelling, rash, fever, numbness, weakness)     No, but has normal swelling to extremity 7. PREGNANCY: "Is there any chance you are pregnant?" "When was your last menstrual period?"     I don't think, has paragard  Protocols used: LEG PAIN-A-AH

## 2018-06-18 ENCOUNTER — Ambulatory Visit (HOSPITAL_COMMUNITY)
Admission: RE | Admit: 2018-06-18 | Discharge: 2018-06-18 | Disposition: A | Discharge status: Home / Self Care | Payer: Medicaid Other | Source: Ambulatory Visit | Attending: Family Medicine | Admitting: Family Medicine

## 2018-06-18 ENCOUNTER — Encounter: Payer: Self-pay | Admitting: Family Medicine

## 2018-06-18 ENCOUNTER — Ambulatory Visit (INDEPENDENT_AMBULATORY_CARE_PROVIDER_SITE_OTHER): Payer: Self-pay | Admitting: Family Medicine

## 2018-06-18 VITALS — BP 110/78 | HR 86 | Temp 98.1°F | Wt 226.0 lb

## 2018-06-18 DIAGNOSIS — R600 Localized edema: Secondary | ICD-10-CM | POA: Diagnosis not present

## 2018-06-18 DIAGNOSIS — F900 Attention-deficit hyperactivity disorder, predominantly inattentive type: Secondary | ICD-10-CM | POA: Diagnosis not present

## 2018-06-18 MED ORDER — AMPHETAMINE-DEXTROAMPHETAMINE 10 MG PO TABS: ORAL_TABLET | ORAL | 0 refills | Status: DC

## 2018-06-18 MED ORDER — AMPHETAMINE-DEXTROAMPHET ER 30 MG PO CP24: 30.0000 mg | ORAL_CAPSULE | ORAL | 0 refills | Status: DC

## 2018-06-18 NOTE — Patient Instructions (Addendum)
Edema Edema is an abnormal buildup of fluids in your bodytissues. Edema is somewhatdependent on gravity to pull the fluid to the lowest place in your body. That makes the condition more common in the legs and thighs (lower extremities). Painless swelling of the feet and ankles is common and becomes more likely as you get older. It is also common in looser tissues, like around your eyes. When the affected area is squeezed, the fluid may move out of that spot and leave a dent for a few moments. This dent is called pitting. What are the causes? There are many possible causes of edema. Eating too much salt and being on your feet or sitting for a long time can cause edema in your legs and ankles. Hot weather may make edema worse. Common medical causes of edema include:  Heart failure.  Liver disease.  Kidney disease.  Weak blood vessels in your legs.  Cancer.  An injury.  Pregnancy.  Some medications.  Obesity.  What are the signs or symptoms? Edema is usually painless.Your skin may look swollen or shiny. How is this diagnosed? Your health care provider may be able to diagnose edema by asking about your medical history and doing a physical exam. You may need to have tests such as X-rays, an electrocardiogram, or blood tests to check for medical conditions that may cause edema. How is this treated? Edema treatment depends on the cause. If you have heart, liver, or kidney disease, you need the treatment appropriate for these conditions. General treatment may include:  Elevation of the affected body part above the level of your heart.  Compression of the affected body part. Pressure from elastic bandages or support stockings squeezes the tissues and forces fluid back into the blood vessels. This keeps fluid from entering the tissues.  Restriction of fluid and salt intake.  Use of a water pill (diuretic). These medications are appropriate only for some types of edema. They pull fluid  out of your body and make you urinate more often. This gets rid of fluid and reduces swelling, but diuretics can have side effects. Only use diuretics as directed by your health care provider.  Follow these instructions at home:  Keep the affected body part above the level of your heart when you are lying down.  Do not sit still or stand for prolonged periods.  Do not put anything directly under your knees when lying down.  Do not wear constricting clothing or garters on your upper legs.  Exercise your legs to work the fluid back into your blood vessels. This may help the swelling go down.  Wear elastic bandages or support stockings to reduce ankle swelling as directed by your health care provider.  Eat a low-salt diet to reduce fluid if your health care provider recommends it.  Only take medicines as directed by your health care provider. Contact a health care provider if:  Your edema is not responding to treatment.  You have heart, liver, or kidney disease and notice symptoms of edema.  You have edema in your legs that does not improve after elevating them.  You have sudden and unexplained weight gain. Get help right away if:  You develop shortness of breath or chest pain.  You cannot breathe when you lie down.  You develop pain, redness, or warmth in the swollen areas.  You have heart, liver, or kidney disease and suddenly get edema.  You have a fever and your symptoms suddenly get worse. This information is   not intended to replace advice given to you by your health care provider. Make sure you discuss any questions you have with your health care provider. Document Released: 12/08/2005 Document Revised: 05/15/2016 Document Reviewed: 09/30/2013 Elsevier Interactive Patient Education  2017 [employer]  Iliotibial Band Syndrome Rehab Ask your health care provider which exercises are safe for you. Do exercises exactly as told by your health care provider and adjust them  as directed. It is normal to feel mild stretching, pulling, tightness, or discomfort as you do these exercises, but you should stop right away if you feel sudden pain or your pain gets worse.Do not begin these exercises until told by your health care provider. Stretching and range of motion exercises These exercises warm up your muscles and joints and improve the movement and flexibility of your hip and pelvis. Exercise A: Quadriceps, prone  1. Lie on your abdomen on a firm surface, such as a bed or padded floor. 2. Bend your left / right knee and hold your ankle. If you cannot reach your ankle or pant leg, loop a belt around your foot and grab the belt instead. 3. Gently pull your heel toward your buttocks. Your knee should not slide out to the side. You should feel a stretch in the front of your thigh and knee. 4. Hold this position for __________ seconds. Repeat __________ times. Complete this stretch __________ times a day. Exercise B: Iliotibial band  1. Lie on your side with your left / right leg in the top position. 2. Bend both of your knees and grab your left / right ankle. Stretch out your bottom arm to help you balance. 3. Slowly bring your top knee back so your thigh goes behind your trunk. 4. Slowly lower your top leg toward the floor until you feel a gentle stretch on the outside of your left / right hip and thigh. If you do not feel a stretch and your knee will not fall farther, place the heel of your other foot on top of your knee and pull your knee down toward the floor with your foot. 5. Hold this position for __________ seconds. Repeat __________ times. Complete this stretch __________ times a day. Strengthening exercises These exercises build strength and endurance in your hip and pelvis. Endurance is the ability to use your muscles for a long time, even after they get tired. Exercise C: Straight leg raises ( hip abductors) 1. Lie on your side with your left / right leg in  the top position. Lie so your head, shoulder, knee, and hip line up. You may bend your bottom knee to help you balance. 2. Roll your hips slightly forward so your hips are stacked directly over each other and your left / right knee is facing forward. 3. Tense the muscles in your outer thigh and lift your top leg 4-6 inches (10-15 cm). 4. Hold this position for __________ seconds. 5. Slowly return to the starting position. Let your muscles relax completely before doing another repetition. Repeat __________ times. Complete this exercise __________ times a day. Exercise D: Straight leg raises ( hip extensors) 1. Lie on your abdomen on your bed or a firm surface. You can put a pillow under your hips if that is more comfortable. 2. Bend your left / right knee so your foot is straight up in the air. 3. Squeeze your buttock muscles and lift your left / right thigh off the bed. Do not let your back arch. 4. Tense this muscle  as hard as you can without increasing any knee pain. 5. Hold this position for __________ seconds. 6. Slowly lower your leg to the starting position and allow it to relax completely. Repeat __________ times. Complete this exercise __________ times a day. Exercise E: Hip hike 1. Stand sideways on a bottom step. Stand on your left / right leg with your other foot unsupported next to the step. You can hold onto the railing or wall if needed for balance. 2. Keep your knees straight and your torso square. Then, lift your left / right hip up toward the ceiling. 3. Slowly let your left / right hip lower toward the floor, past the starting position. Your foot should get closer to the floor. Do not lean or bend your knees. Repeat __________ times. Complete this exercise __________ times a day. This information is not intended to replace advice given to you by your health care provider. Make sure you discuss any questions you have with your health care provider. Document Released: 12/08/2005  Document Revised: 08/12/2016 Document Reviewed: 11/09/2015 Elsevier Interactive Patient Education  [employer].

## 2018-06-18 NOTE — Telephone Encounter (Signed)
Pt was seen this morning and Rx was sent to the pharmacy

## 2018-06-18 NOTE — Progress Notes (Signed)
Subjective:    Patient ID: Cindy [name], female    DOB: 1981/01/27, 37 y.o.   MRN: [id number]  No chief complaint on file.   HPI Patient was seen today for acute concern.  Pt endorses right leg pain and edema x 3 months. Pain in R hip down lateral R thigh.  When pt is driving the pain then goes past her R knee.  Pt endorses driving "long distance" with her son who plays basketball.  Pt denies erythema, pain in back or butt, recent injury, fever, chills, cough, SOB.  Pt notes b/l LE edema since 2005 after having her son.  She takes Lasix 40 mg as needed for swelling.  CMP on 02/24/2018 was largely normal with mildly elevated K+.  Past Medical History:  Diagnosis Date  . Cellulitis of calf   . Chicken pox   . Depression   . Edema of left foot    onset 11 years  . Generalized headaches   . Headache(784.0)    Muscle tension headaches.  . Migraines   . No pertinent past medical history   . UTI (lower urinary tract infection)   . UTI (urinary tract infection)     Allergies  Allergen Reactions  . Mushroom Extract Complex Other (See Comments)    Itching of tongue and eyes    ROS General: Denies fever, chills, night sweats, changes in weight, changes in appetite HEENT: Denies headaches, ear pain, changes in vision, rhinorrhea, sore throat CV: Denies CP, palpitations, SOB, orthopnea Pulm: Denies SOB, cough, wheezing GI: Denies abdominal pain, nausea, vomiting, diarrhea, constipation GU: Denies dysuria, hematuria, frequency, vaginal discharge Msk: Denies muscle cramps, joint pains  +LE edema, RLE pain and increased edema Neuro: Denies weakness, numbness, tingling Skin: Denies rashes, bruising Psych: Denies depression, anxiety, hallucinations     Objective:    Blood pressure 110/78, pulse 86, temperature 98.1 F (36.7 C), temperature source Oral, weight 226 lb (102.5 kg), SpO2 98 %.   Gen. Pleasant, well-nourished, in no distress, normal affect   HEENT: [place]/AT, face  symmetric,  no scleral icterus, PERRLA, nares patent without drainage, pharynx without erythema or exudate. Lungs: no accessory muscle use, CTAB, no wheezes or rales Cardiovascular: RRR, trace peripheral edema b/l Musculoskeletal: no cyanosis or clubbing, normal tone.  R calf, R IT band, and medial R thigh with TTP Neuro:  A&Ox3, CN II-XII intact, normal gait Skin:  Warm, no lesions/ rash   Wt Readings from Last 3 Encounters:  06/18/18 226 lb (102.5 kg)  02/22/18 230 lb (104.3 kg)  07/13/17 238 lb 1 oz (108 kg)    Lab Results  Component Value Date   WBC 14.9 (H) 05/05/2016   HGB 12.8 05/05/2016   HCT 37.7 05/05/2016   PLT 263 05/05/2016   GLUCOSE 95 02/24/2018   CHOL 135 02/24/2018   TRIG 43.0 02/24/2018   HDL 55.00 02/24/2018   LDLCALC 71 02/24/2018   ALT 13 02/24/2018   AST 10 02/24/2018   NA 140 02/24/2018   K 5.2 (H) 02/24/2018   CL 106 02/24/2018   CREATININE 0.73 02/24/2018   BUN 9 02/24/2018   CO2 28 02/24/2018   TSH 1.520 11/25/2016   HGBA1C 5.6 02/24/2018    Assessment/Plan:  Leg edema, right  -ongoing h/o b/l edema, however R>L with calf tenderness.  Will place stat order for RLE doppler.  [facility] om [facility] [address]. -discussed continuing to elevate LEs when sitting. -given exercises for IT band  syndrome. - Plan: VAS [place] LOWER EXTREMITY VENOUS (DVT) -Given RTC or ED precautions  ADHD -refill on Adderall XR 30 mg and adderall 10 mg given x 3 months.  F/u in prn  Abbe Amsterdam, MD

## 2018-06-21 ENCOUNTER — Ambulatory Visit: Payer: Self-pay

## 2018-06-21 NOTE — Telephone Encounter (Signed)
Pt. Called c/o increased swelling and pain to right leg. Also noticed over the weekend the color to her leg looked "bluish". Hurts to drive - after 10 minutes has increased pain. Offered appointment, but because of new insurance does not want to come in unless provider thinks it is necessary. Would like to know if there is anything else to be done. Reports her feet turn in while walking - could this be a factor. Please advise pt.  Reason for Disposition . [1] MODERATE leg swelling (e.g., swelling extends up to knees) AND [2] new onset or worsening  Answer Assessment - Initial Assessment Questions 1. ONSET: "When did the swelling start?" (e.g., minutes, hours, days)     Started 2 months ago but has gotten worse 2. LOCATION: "What part of the leg is swollen?"  "Are both legs swollen or just one leg?"     Right leg from knee down 3. SEVERITY: "How bad is the swelling?" (e.g., localized; mild, moderate, severe)  - Localized - small area of swelling localized to one leg  - MILD pedal edema - swelling limited to foot and ankle, pitting edema < 1/4 inch (6 mm) deep, rest and elevation eliminate most or all swelling  - MODERATE edema - swelling of lower leg to knee, pitting edema > 1/4 inch (6 mm) deep, rest and elevation only partially reduce swelling  - SEVERE edema - swelling extends above knee, facial or hand swelling present      Moderate 4. REDNESS: "Does the swelling look red or infected?"     Leg "looked bluish in color." 5. PAIN: "Is the swelling painful to touch?" If so, ask: "How painful is it?"   (Scale 1-10; mild, moderate or severe)     8 6. FEVER: "Do you have a fever?" If so, ask: "What is it, how was it measured, and when did it start?"      No 7. CAUSE: "What do you think is causing the leg swelling?"     Unsure 8. MEDICAL HISTORY: "Do you have a history of heart failure, kidney disease, liver failure, or cancer?"     No 9. RECURRENT SYMPTOM: "Have you had leg swelling before?" If  so, ask: "When was the last time?" "What happened that time?"     Yes 10. OTHER SYMPTOMS: "Do you have any other symptoms?" (e.g., chest pain, difficulty breathing)       No 11. PREGNANCY: "Is there any chance you are pregnant?" "When was your last menstrual period?"       No  Protocols used: LEG SWELLING AND EDEMA-A-AH

## 2018-06-21 NOTE — Telephone Encounter (Signed)
Spoke with pt state that her leg was swollen and bluish in color, states that she was concerned and wanted advise. Dr Salomon FickBanks advised pt to go to the ED for evaluation but pt complained that she did not feel like she needed to to the ED, pt states that she is not satisfied with the US of the Venous Doppler, states that the tech did not do the Ultra sound of the back of her leg Advised that the ED will do more imaging and test that will rule out the problem, Pt refused to got the ED and hang up the phone. Dr Salomon FickBanks is aware

## 2018-08-12 ENCOUNTER — Other Ambulatory Visit: Payer: Self-pay | Admitting: Family Medicine

## 2018-08-12 NOTE — Telephone Encounter (Signed)
Please Advise

## 2018-08-12 NOTE — Telephone Encounter (Signed)
Copied from CRM [id number]. Topic: Quick Communication - Rx Refill/Question >> Aug 12, 2018 11:00 AM Gerrianne ScalePayne, Jhamir Pickup L wrote: Medication: amphetamine-dextroamphetamine (ADDERALL) 10 MG tablet  Has the patient contacted their pharmacy? Yes.   (Agent: If no, request that the patient contact the pharmacy for the refill.) (Agent: If yes, when and what did the pharmacy advise?)  call provider   Preferred Pharmacy (with phone number or street name):   [id number] - [place], [place] - [address] [facility] [address] [facility] [address] [phone] (Phone) [phone] (Fax)    Agent: Please be advised that RX refills may take up to 3 business days. We ask that you follow-up with your pharmacy.

## 2018-08-12 NOTE — Telephone Encounter (Signed)
Called pt left a detail message regarding her Adderall refill request, advise pt to call her pharmacy they should have one more refill on the Rx. Due for refill in 09/18/2018

## 2018-08-12 NOTE — Telephone Encounter (Signed)
Refills on pt's adderall not due until next month per chart. Given refills in 05/2018 x 3 rx's.

## 2018-08-12 NOTE — Telephone Encounter (Signed)
Adderall refill Last Refill: 06/18/18 # 30 Last OV: 06/18/18 PCP: Dr Abbe AmsterdamShannon Banks Pharmacy: [facility] [address] [place]

## 2018-08-30 ENCOUNTER — Encounter: Payer: Self-pay | Admitting: Family Medicine

## 2018-08-30 ENCOUNTER — Ambulatory Visit: Payer: Self-pay | Admitting: *Deleted

## 2018-08-30 NOTE — Telephone Encounter (Signed)
This encounter was created in error - please disregard.

## 2018-08-30 NOTE — Telephone Encounter (Signed)
No triage appropriate. See note:   Patient has been experiencing a fine rash along her jaw line that turns to painful pus-filled pimples on her face from her temples downward. No other rash identified on her body. Stated this began about one month ago. She seems to break out as soon as some clear, not associated with her cycle. She has not changed hygiene routine nor new products of any kind. Also stated she feels there has been a change of odor in the vaginal area. Denies any pain or drainage with voiding/vagina. Appointment made with PCP.  Answer Assessment - Initial Assessment Questions 1.) CALLER DIAGNOSIS: "What do you think is causing the rash?" (e.g., Chickenpox, Hives, Impetigo, Athlete's Foot, etc.) does not know 2.) LOCATION:  "Is it widespread or localized?" localized to the face. 3.) NEW MEDICATIONS: "Are you taking any new medicine?"no.  Protocols used: RASH - GUIDELINE SELECTION-A-AH

## 2018-09-03 ENCOUNTER — Ambulatory Visit: Payer: Medicaid Other | Admitting: Family Medicine

## 2018-09-03 DIAGNOSIS — Z0289 Encounter for other administrative examinations: Secondary | ICD-10-CM

## 2018-10-05 ENCOUNTER — Other Ambulatory Visit: Payer: Self-pay | Admitting: Family Medicine

## 2018-10-05 DIAGNOSIS — F900 Attention-deficit hyperactivity disorder, predominantly inattentive type: Secondary | ICD-10-CM

## 2018-10-06 NOTE — Telephone Encounter (Signed)
Pt last OV was 06/18/2018 and last refill was 06/18/2018 please Advice

## 2018-10-11 MED ORDER — AMPHETAMINE-DEXTROAMPHETAMINE 10 MG PO TABS: ORAL_TABLET | ORAL | 0 refills | Status: DC

## 2018-10-11 MED ORDER — AMPHETAMINE-DEXTROAMPHET ER 30 MG PO CP24: 30.0000 mg | ORAL_CAPSULE | ORAL | 0 refills | Status: DC

## 2018-10-11 NOTE — Telephone Encounter (Signed)
Adderall filled x 1 month.  Pt needs to be seen for more refills.

## 2018-10-12 ENCOUNTER — Telehealth: Payer: Self-pay

## 2018-10-12 NOTE — Telephone Encounter (Signed)
Rx was sent to pt pharmacy on 10/11/2018.

## 2018-10-22 ENCOUNTER — Telehealth: Payer: Self-pay | Admitting: *Deleted

## 2018-10-22 NOTE — Telephone Encounter (Signed)
Copied from CRM [id number]. Topic: General - Other >> Oct 22, 2018  2:00 PM Ronney Lion A wrote: Reason for CRM: pt called in she says that her new pharmacy is requesting a prior authorization for the medications amphetamine-dextroamphetamine (ADDERALL XR) 30 MG 24 hr capsule, and amphetamine-dextroamphetamine (ADDERALL) 10 MG tablet, the pharmacy that it would need to be sent to will be;   [id number] [place], [place] - [address]  [phone] (Phone) [phone] (Fax)

## 2018-10-26 NOTE — Telephone Encounter (Signed)
Relation to pt: self  Call back number:[phone] Pharmacy: [id number] - [place], [place] - [address] AT [facility] [address] [facility] [address] [phone] (Phone) [phone] (Fax)     Reason for call:  [facility] with updated insurance information.

## 2018-10-26 NOTE — Telephone Encounter (Signed)
Patient would like to know if any paperwork for ADA has arrived? Patient will have to give her new insurance information to pharmacy before prior auth can be completed.

## 2018-10-27 NOTE — Telephone Encounter (Signed)
Please Advise

## 2018-10-27 NOTE — Telephone Encounter (Signed)
Pt is in the office and wanted to know if she could go on generic for Adderall XR 30 and Adderall 10 due to the the high cost of the medication and wanted to know if the generic would work the same as the brand name.  If you think that the generic will work with the same strength please [facility] to [facility] [address]  Pt stated that a PA has been sent in to [employer]  and [employer] and [employer] will not cover at [employer].  Pt form was found in the green folder without any MRN"s on it and it was put in the red folder to be completed Harriett Sine is aware of the finding of the form and stated that she will call the pt once the form is completed).   Pt is aware of all of the above.

## 2018-10-28 ENCOUNTER — Other Ambulatory Visit: Payer: Self-pay | Admitting: Family Medicine

## 2018-10-28 DIAGNOSIS — F900 Attention-deficit hyperactivity disorder, predominantly inattentive type: Secondary | ICD-10-CM

## 2018-10-28 MED ORDER — AMPHETAMINE-DEXTROAMPHETAMINE 10 MG PO TABS: ORAL_TABLET | ORAL | 0 refills | Status: DC

## 2018-10-28 MED ORDER — AMPHETAMINE-DEXTROAMPHET ER 30 MG PO CP24: 30.0000 mg | ORAL_CAPSULE | ORAL | 0 refills | Status: DC

## 2018-10-28 NOTE — Telephone Encounter (Signed)
Refills sent to pharmacy.  Pt needs to be seen for further refills.

## 2018-10-28 NOTE — Telephone Encounter (Signed)
Pt called the office very upset regarding her Forms that needed completing, explained to pt that the forms were found yesterday afternoon and have been placed in Dr Salomon Fick binder for completing, pt kept interrupting as I explained that Dr Salomon Fick will complete the forms as soon as she is done seeing pt. I apologized to pt for the delay but was very upset and did not listen to me, told me to have a good day and hang up the phone.

## 2018-10-29 NOTE — Telephone Encounter (Signed)
Pt paperwork has been completed and faxed to the fax number provided on the packet

## 2018-11-02 NOTE — Telephone Encounter (Signed)
FMLA forms have been refax and confirmation received

## 2018-12-29 ENCOUNTER — Other Ambulatory Visit: Payer: Self-pay | Admitting: Family Medicine

## 2018-12-29 DIAGNOSIS — F900 Attention-deficit hyperactivity disorder, predominantly inattentive type: Secondary | ICD-10-CM

## 2018-12-29 NOTE — Telephone Encounter (Signed)
Copied from CRM [id number]. Topic: Quick Communication - Rx Refill/Question >> Dec 29, 2018  1:20 PM Mcneil, Ja-Kwan wrote: Medication: amphetamine-dextroamphetamine (ADDERALL XR) 30 MG 24 hr capsule  and  amphetamine-dextroamphetamine (ADDERALL) 10 MG tablet  Has the patient contacted their pharmacy? no  Preferred Pharmacy (with phone number or street name): [id number] [place] [place] - [address] [phone] (Phone)  [phone] (Fax)  Agent: Please be advised that RX refills may take up to 3 business days. We ask that you follow-up with your pharmacy.

## 2019-01-03 NOTE — Telephone Encounter (Signed)
Patient called and advised she will need an appointment for more refills of Adderall. She verbalized understanding and says that her insurance changed and Corinda Gubler is not under the new insurance, so she will need to find a new doctor. She says she is out of the medication since 12/28/18. I advised I will send to Dr. Salomon Fick for a courtesy refill of 30 days to cover until a new provider is found, she verbalized understanding and says to tell Dr. Salomon Fick thank you.

## 2019-01-03 NOTE — Telephone Encounter (Signed)
Pt called to follow up on refill request for dextroamphetamine (ADDERALL XR) 30 MG 24 hr capsule  and  amphetamine-dextroamphetamine (ADDERALL) 10 MG tablet sent on 12/29/18. Please advise.

## 2019-01-03 NOTE — Telephone Encounter (Signed)
Please advise 

## 2019-01-03 NOTE — Addendum Note (Signed)
Addended by: Stevphen Meuse on: 01/03/2019 09:04 AM   Modules accepted: Orders

## 2019-01-03 NOTE — Telephone Encounter (Signed)
Requested medication (s) are due for refill today: Yes  Requested medication (s) are on the active medication list: Yes  Last refill:  10/28/18  Future visit scheduled: Yes  Notes to clinic:  Unable to refill per protocol, 2nd patient request. See attached notes, requesting a 30 day courtesy refill.      Requested Prescriptions  Pending Prescriptions Disp Refills   amphetamine-dextroamphetamine (ADDERALL XR) 30 MG 24 hr capsule 30 capsule 0    Sig: Take 1 capsule (30 mg total) by mouth every morning.     Not Delegated - Psychiatry:  Stimulants/ADHD Failed - 01/03/2019  9:56 AM      Failed - This refill cannot be delegated      Failed - Urine Drug Screen completed in last 360 days.      Failed - Valid encounter within last 3 months    Recent Outpatient Visits          6 months ago Leg edema, right   [facility] at [employer], Bettey Mare, MD   10 months ago Well adult exam   [facility] conservation officer at [employer], Bettey Mare, MD

## 2019-01-04 NOTE — Telephone Encounter (Signed)
Pt last seen 6 months ago.  Unable to refill Adderall until she is seen.

## 2019-01-04 NOTE — Telephone Encounter (Signed)
Patient is returning call to office regarding her medication.  Please call patient back at [phone]

## 2019-01-04 NOTE — Telephone Encounter (Signed)
Tried to call pt got a message that my call cannot be completed to try again later

## 2019-01-05 NOTE — Telephone Encounter (Signed)
° °  Pt call to say her insurance has changed and can no longer come to [place] and will be looking for another PCP and is Adderall  asking for a refill for 30 days

## 2019-01-06 NOTE — Telephone Encounter (Signed)
Please advise 

## 2019-01-07 ENCOUNTER — Ambulatory Visit: Payer: Managed Care, Other (non HMO) | Admitting: Internal Medicine

## 2019-01-07 NOTE — Telephone Encounter (Signed)
Called pt left a message to call the office back regarding her Rx refill request for Adderall

## 2019-02-10 ENCOUNTER — Other Ambulatory Visit: Payer: Self-pay

## 2019-02-10 ENCOUNTER — Emergency Department (HOSPITAL_COMMUNITY)
Admission: EM | Admit: 2019-02-10 | Discharge: 2019-02-10 | Disposition: A | Discharge status: Home / Self Care | Payer: Medicaid Other | Attending: Emergency Medicine | Admitting: Emergency Medicine

## 2019-02-10 ENCOUNTER — Encounter (HOSPITAL_COMMUNITY): Payer: Self-pay | Admitting: Emergency Medicine

## 2019-02-10 ENCOUNTER — Emergency Department (HOSPITAL_COMMUNITY): Payer: Medicaid Other

## 2019-02-10 DIAGNOSIS — R51 Headache: Secondary | ICD-10-CM

## 2019-02-10 DIAGNOSIS — T520X4A Toxic effect of petroleum products, undetermined, initial encounter: Secondary | ICD-10-CM | POA: Insufficient documentation

## 2019-02-10 DIAGNOSIS — R519 Headache, unspecified: Secondary | ICD-10-CM

## 2019-02-10 DIAGNOSIS — Z79899 Other long term (current) drug therapy: Secondary | ICD-10-CM | POA: Insufficient documentation

## 2019-02-10 DIAGNOSIS — Z77098 Contact with and (suspected) exposure to other hazardous, chiefly nonmedicinal, chemicals: Secondary | ICD-10-CM

## 2019-02-10 LAB — BLOOD GAS, ARTERIAL
Acid-Base Excess: 2.1 mmol/L — ABNORMAL HIGH (ref 0.0–2.0)
BICARBONATE: 25.9 mmol/L (ref 20.0–28.0)
FIO2: 21
O2 Saturation: 96.8 %
Patient temperature: 37
pCO2 arterial: 43.9 mmHg (ref 32.0–48.0)
pH, Arterial: 7.397 (ref 7.350–7.450)
pO2, Arterial: 97.5 mmHg (ref 83.0–108.0)

## 2019-02-10 NOTE — ED Notes (Signed)
Per registration, patient left the ER. Patient left prior to being discharged.
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
## 2019-02-10 NOTE — ED Notes (Signed)
Patient walked out of room and starting walking down hallway to exit. Asked patient where she was going and patient states "I'm going to check on my mom." Patient had pocketbook on her arm and jacket on.
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
## 2021-11-04 ENCOUNTER — Other Ambulatory Visit: Payer: Self-pay | Admitting: Family Medicine

## 2021-11-04 DIAGNOSIS — Z1231 Encounter for screening mammogram for malignant neoplasm of breast: Secondary | ICD-10-CM

## 2022-05-07 ENCOUNTER — Encounter (HOSPITAL_COMMUNITY): Payer: Self-pay

## 2022-05-07 ENCOUNTER — Ambulatory Visit (HOSPITAL_COMMUNITY): Payer: Medicaid Other | Admitting: Licensed Clinical Social Worker

## 2022-06-05 ENCOUNTER — Encounter (HOSPITAL_BASED_OUTPATIENT_CLINIC_OR_DEPARTMENT_OTHER): Payer: Self-pay

## 2022-06-05 ENCOUNTER — Ambulatory Visit (HOSPITAL_BASED_OUTPATIENT_CLINIC_OR_DEPARTMENT_OTHER): Admit: 2022-06-05 | Payer: Medicaid Other | Admitting: Obstetrics and Gynecology

## 2022-06-05 SURGERY — HYSTERECTOMY, VAGINAL
Anesthesia: Choice

## 2022-08-27 ENCOUNTER — Encounter (HOSPITAL_BASED_OUTPATIENT_CLINIC_OR_DEPARTMENT_OTHER): Payer: Self-pay | Admitting: Obstetrics and Gynecology

## 2022-08-27 DIAGNOSIS — A048 Other specified bacterial intestinal infections: Secondary | ICD-10-CM

## 2022-08-27 HISTORY — DX: Other specified bacterial intestinal infections: A04.8

## 2022-08-28 ENCOUNTER — Other Ambulatory Visit: Payer: Self-pay

## 2022-08-28 ENCOUNTER — Encounter (HOSPITAL_BASED_OUTPATIENT_CLINIC_OR_DEPARTMENT_OTHER): Payer: Self-pay | Admitting: Obstetrics and Gynecology

## 2022-08-28 NOTE — Progress Notes (Signed)
Spoke w/ via phone for pre-op interview---Cindy [name] needs dos----urine pregnancy               [name] results------09/11/22 [name] appt for cbc, bmp, type & screen COVID test -----patient states asymptomatic no test needed Arrive at -------0530 on Monday, 09/15/2022 NPO after MN NO Solid Food.  Clear liquids from MN until---0430 Med rec completed Medications to take morning of surgery -----Wellbutrin, Zoloft, clonazepam prn Diabetic medication -----n/a Patient instructed no nail polish to be worn day of surgery Patient instructed to bring photo id and insurance card day of surgery Patient aware to have Driver (ride ) / caregiver    for 24 hours after surgery - fiance, Cindy [name] Patient Special Instructions -----Extended/ overnight instructions given. Pre-Op special Istructions -----none Patient verbalized understanding of instructions that were given at this phone interview. Patient denies shortness of breath, chest pain, fever, cough at this phone interview.

## 2022-08-28 NOTE — Progress Notes (Signed)
Your procedure is scheduled on Monday, 09/15/2022.  Report to [facility] AT  5:30 A. M.   Call this number if you have problems the morning of surgery  :[phone].   OUR ADDRESS IS [address].  WE [facility] LOCATED IN [facility].  PLEASE BRING YOUR INSURANCE CARD AND PHOTO ID DAY OF SURGERY.  ONLY 2 PEOPLE ARE ALLOWED IN  WAITING  ROOM.                                      REMEMBER:  DO NOT EAT FOOD, CANDY GUM OR MINTS  AFTER MIDNIGHT THE NIGHT BEFORE YOUR SURGERY . YOU MAY HAVE CLEAR LIQUIDS FROM MIDNIGHT THE NIGHT BEFORE YOUR SURGERY UNTIL  4:30 AM. NO CLEAR LIQUIDS AFTER   4:30 AM DAY OF SURGERY.  YOU MAY  BRUSH YOUR TEETH MORNING OF SURGERY AND RINSE YOUR MOUTH OUT, NO CHEWING GUM CANDY OR MINTS.     CLEAR LIQUID DIET   Foods Allowed                                                                     Foods Excluded  Coffee and tea, regular and decaf                             liquids that you cannot  Plain Jell-O                                                                   see through such as: Fruit ices (not with fruit pulp)                                     milk, soups, orange juice  Plain  Popsicles                                    All solid food Carbonated beverages, regular and diet                                    Cranberry, grape and apple juices Sports drinks like Gatorade _____________________________________________________________________     TAKE THESE MEDICATIONS MORNING OF SURGERY: Wellbutrin, Zoloft, Clonazepam if needed    UP TO 4 VISITORS  MAY VISIT IN THE EXTENDED RECOVERY ROOM UNTIL 800 PM ONLY.  ONE  VISITOR AGE 75 AND OVER MAY SPEND THE NIGHT AND MUST BE IN EXTENDED RECOVERY ROOM NO LATER THAN 800 PM . YOUR DISCHARGE TIME AFTER YOU SPEND THE NIGHT IS 900 AM THE MORNING AFTER YOUR SURGERY.  YOU MAY PACK A SMALL OVERNIGHT BAG WITH TOILETRIES FOR  YOUR OVERNIGHT STAY IF YOU WISH.  YOUR  PRESCRIPTION MEDICATIONS WILL BE PROVIDED DURING [place].                                      DO NOT WEAR JEWERLY, MAKE UP. DO NOT WEAR LOTIONS, POWDERS, PERFUMES OR NAIL POLISH ON YOUR FINGERNAILS. TOENAIL POLISH IS OK TO WEAR. DO NOT SHAVE FOR 48 HOURS PRIOR TO DAY OF SURGERY. MEN MAY SHAVE FACE AND NECK. CONTACTS, GLASSES, OR DENTURES MAY NOT BE WORN TO SURGERY.  REMEMBER: NO SMOKING, DRUGS OR ALCOHOL FOR 24 HOURS BEFORE YOUR SURGERY.                                    [facility] IS NOT RESPONSIBLE  FOR ANY BELONGINGS.                                                                    Marland Kitchen            - Preparing for Surgery Before surgery, you can play an important role.  Because skin is not sterile, your skin needs to be as free of germs as possible.  You can reduce the number of germs on your skin by washing with CHG (chlorahexidine gluconate) soap before surgery.  CHG is an antiseptic cleaner which kills germs and bonds with the skin to continue killing germs even after washing. Please DO NOT use if you have an allergy to CHG or antibacterial soaps.  If your skin becomes reddened/irritated stop using the CHG and inform your nurse when you arrive at Short Stay. Do not shave (including legs and underarms) for at least 48 hours prior to the first CHG shower.  You may shave your face/neck. Please follow these instructions carefully:  1.  Shower with CHG Soap the night before surgery and the  morning of Surgery.  2.  If you choose to wash your hair, wash your hair first as usual with your  normal  shampoo.  3.  After you shampoo, rinse your hair and body thoroughly to remove the  shampoo.                            4.  Use CHG as you would any other liquid soap.  You can apply chg directly  to the skin and wash , please wash your belly button thoroughly with chg soap provided night before and morning of your surgery.                     Gently with a scrungie or clean  washcloth.  5.  Apply the CHG Soap to your body ONLY FROM THE NECK DOWN.   Do not use on face/ open                           Wound or open sores. Avoid contact with eyes, ears mouth and genitals (private parts).  Wash face,  Genitals (private parts) with your normal soap.             6.  Wash thoroughly, paying special attention to the area where your surgery  will be performed.  7.  Thoroughly rinse your body with warm water from the neck down.  8.  DO NOT shower/wash with your normal soap after using and rinsing off  the CHG Soap.                9.  Pat yourself dry with a clean towel.            10.  Wear clean pajamas.            11.  Place clean sheets on your bed the night of your first shower and do not  sleep with pets. Day of Surgery : Do not apply any lotions/deodorants the morning of surgery.  Please wear clean clothes to the hospital/surgery center.  IF YOU HAVE ANY SKIN IRRITATION OR PROBLEMS WITH THE SURGICAL SOAP, PLEASE GET A BAR OF GOLD DIAL SOAP AND SHOWER THE NIGHT BEFORE YOUR SURGERY AND THE MORNING OF YOUR SURGERY. PLEASE LET THE NURSE KNOW MORNING OF YOUR SURGERY IF YOU HAD ANY PROBLEMS WITH THE SURGICAL SOAP.   ________________________________________________________________________                                                        QUESTIONS [facility] PRE OP NURSE PHONE [phone].

## 2022-09-11 ENCOUNTER — Encounter (HOSPITAL_COMMUNITY)
Admission: RE | Admit: 2022-09-11 | Discharge: 2022-09-11 | Disposition: A | Discharge status: Home / Self Care | Payer: Medicaid Other | Source: Ambulatory Visit | Attending: Obstetrics and Gynecology | Admitting: Obstetrics and Gynecology

## 2022-09-11 DIAGNOSIS — R102 Pelvic and perineal pain: Secondary | ICD-10-CM | POA: Insufficient documentation

## 2022-09-11 DIAGNOSIS — Z01812 Encounter for preprocedural laboratory examination: Secondary | ICD-10-CM | POA: Diagnosis present

## 2022-09-11 LAB — BASIC METABOLIC PANEL
Anion gap: 7 (ref 5–15)
BUN: 8 mg/dL (ref 6–20)
CO2: 25 mmol/L (ref 22–32)
Calcium: 9.1 mg/dL (ref 8.9–10.3)
Chloride: 110 mmol/L (ref 98–111)
Creatinine, Ser: 0.69 mg/dL (ref 0.44–1.00)
GFR, Estimated: >60 mL/min (ref >60)
Glucose, Bld: 90 mg/dL (ref 70–99)
Potassium: 4.1 mmol/L (ref 3.5–5.1)
Sodium: 142 mmol/L (ref 135–145)

## 2022-09-11 LAB — CBC
HCT: 38.2 % (ref 36.0–46.0)
Hemoglobin: 12.4 g/dL (ref 12.0–15.0)
MCH: 30.5 pg (ref 26.0–34.0)
MCHC: 32.5 g/dL (ref 30.0–36.0)
MCV: 93.9 fL (ref 80.0–100.0)
Platelets: 257 10*3/uL (ref 150–400)
RBC: 4.07 MIL/uL (ref 3.87–5.11)
RDW: 12.4 % (ref 11.5–15.5)
WBC: 7.5 10*3/uL (ref 4.0–10.5)
nRBC: 0 % (ref 0.0–0.2)

## 2022-09-14 NOTE — H&P (Signed)
Cindy [name] is an 41 y.o. female. She has Mirena IUD, minimal vaginal bleeding, persistent mild midline pelvic pain.  2 pelvic ultrasounds have confirmed IUD in proper position, probable adenomyosis, ovaries c/w PCOS.  Her pain is significantly affecting her life, and she is ready for definitive surgical treatment.  Pertinent Gynecological History: Last pap: normal Date: 07/2021 OB History: G5, P2032 SVD x 2   Menstrual History: Patient's last menstrual period was 08/04/2022.    Past Medical History:  Diagnosis Date   ADHD (attention deficit hyperactivity disorder)    hx of being on Adderall, pt states that she was told her symptoms were mostly from anxiety   Anxiety    Patient follows with [facility].   Cellulitis of calf    Chicken pox    Depression    Patient follows with [facility].   Edema of left foot    onset 9 years   GERD (gastroesophageal reflux disease)    H. pylori infection 08/27/2022   Pt states that she recently had an EGD and was diagnosed w/ H.pylori infections. As of 08/27/22, pt states that she has not started treatment with antibiotics.   Headache(784.0)    Muscle tension headaches.   Migraines    hx of, resolved as of 08/27/22 per pt   PCOS (polycystic ovarian syndrome)    Follows with [facility], LOV 08/20/22.   UTI (urinary tract infection)    Vitamin D deficiency    08/27/22 Currently taking Vitamin D supplement. Follows with PCP, Knox Royalty at [facility].   Wears glasses     Past Surgical History:  Procedure Laterality Date   ESOPHAGOGASTRODUODENOSCOPY     HAND SURGERY  2006   ORIF of left index and middle finger distal MC bones   LEEP  2004   LEEP  2000   pins in hand Left 2003   WISDOM TOOTH EXTRACTION  High School   All 4    Family History  Adopted: Yes  Problem Relation Age of Onset   Blindness Maternal Aunt        Not sure if Glaucoma    Social History:  reports that she  has never smoked. She has never used smokeless tobacco. She reports current alcohol use. She reports that she does not currently use drugs after having used the following drugs: Marijuana.  Allergies:  Allergies  Allergen Reactions   Mushroom Extract Complex Other (See Comments)    Itching of tongue and eyes   Shrimp Flavor Itching    No medications prior to admission.    Review of Systems  Respiratory: Negative.    Cardiovascular: Negative.     Height 5\' 9"  (1.753 m), weight 125.2 kg, last menstrual period 08/04/2022. Physical Exam Constitutional:      Appearance: Normal appearance.  Cardiovascular:     Rate and Rhythm: Normal rate and regular rhythm.     Heart sounds: Normal heart sounds. No murmur heard. Pulmonary:     Effort: Pulmonary effort is normal. No respiratory distress.     Breath sounds: Normal breath sounds. No wheezing.  Abdominal:     General: There is no distension.     Palpations: Abdomen is soft. There is no mass.     Tenderness: There is no abdominal tenderness.  Genitourinary:    General: Normal vulva.     Comments: Uterus normal size No adnexal mass Musculoskeletal:     Cervical back: Normal range of motion and  neck supple.  Neurological:     Mental Status: She is alert.     No results found for this or any previous visit (from the past 24 hour(s)).  No results found.  Assessment/Plan: Persistent pelvic pain with probable adenomyosis by ultrasound.  Already has IUD, all medical and surgical options have been discussed.  Surgical procedure, risks, alternatives have all been discussed, questions answered.  Will admit for Cindy [name], possible bilateral salpingectomy, cystoscopy.  Blane Ohara Tyriq Moragne 09/14/2022, 5:15 PM

## 2022-09-15 ENCOUNTER — Encounter (HOSPITAL_BASED_OUTPATIENT_CLINIC_OR_DEPARTMENT_OTHER)
Admission: RE | Disposition: A | Discharge status: Home / Self Care | Payer: Self-pay | Source: Home / Self Care | Attending: Obstetrics and Gynecology

## 2022-09-15 ENCOUNTER — Ambulatory Visit (HOSPITAL_BASED_OUTPATIENT_CLINIC_OR_DEPARTMENT_OTHER): Payer: Medicaid Other | Admitting: Anesthesiology

## 2022-09-15 ENCOUNTER — Encounter (HOSPITAL_BASED_OUTPATIENT_CLINIC_OR_DEPARTMENT_OTHER): Payer: Self-pay | Admitting: Obstetrics and Gynecology

## 2022-09-15 ENCOUNTER — Other Ambulatory Visit: Payer: Self-pay

## 2022-09-15 ENCOUNTER — Ambulatory Visit (HOSPITAL_BASED_OUTPATIENT_CLINIC_OR_DEPARTMENT_OTHER)
Admission: RE | Admit: 2022-09-15 | Discharge: 2022-09-16 | Disposition: A | Discharge status: Home / Self Care | Payer: Medicaid Other | Attending: Obstetrics and Gynecology | Admitting: Obstetrics and Gynecology

## 2022-09-15 DIAGNOSIS — E282 Polycystic ovarian syndrome: Secondary | ICD-10-CM | POA: Diagnosis not present

## 2022-09-15 DIAGNOSIS — Z90710 Acquired absence of both cervix and uterus: Secondary | ICD-10-CM | POA: Diagnosis present

## 2022-09-15 DIAGNOSIS — K219 Gastro-esophageal reflux disease without esophagitis: Secondary | ICD-10-CM | POA: Diagnosis not present

## 2022-09-15 DIAGNOSIS — N8003 Adenomyosis of the uterus: Secondary | ICD-10-CM | POA: Diagnosis not present

## 2022-09-15 DIAGNOSIS — Z87440 Personal history of urinary (tract) infections: Secondary | ICD-10-CM | POA: Insufficient documentation

## 2022-09-15 DIAGNOSIS — R102 Pelvic and perineal pain: Secondary | ICD-10-CM

## 2022-09-15 DIAGNOSIS — Z6841 Body Mass Index (BMI) 40.0 and over, adult: Secondary | ICD-10-CM

## 2022-09-15 DIAGNOSIS — Z01818 Encounter for other preprocedural examination: Secondary | ICD-10-CM

## 2022-09-15 DIAGNOSIS — G8929 Other chronic pain: Secondary | ICD-10-CM | POA: Diagnosis present

## 2022-09-15 HISTORY — DX: Attention-deficit hyperactivity disorder, unspecified type: F90.9

## 2022-09-15 HISTORY — PX: CYSTOSCOPY: SHX5120

## 2022-09-15 HISTORY — DX: Vitamin D deficiency, unspecified: E55.9

## 2022-09-15 HISTORY — PX: VAGINAL HYSTERECTOMY: SHX2639

## 2022-09-15 HISTORY — DX: Polycystic ovarian syndrome: E28.2

## 2022-09-15 HISTORY — DX: Presence of spectacles and contact lenses: Z97.3

## 2022-09-15 HISTORY — DX: Gastro-esophageal reflux disease without esophagitis: K21.9

## 2022-09-15 HISTORY — DX: Anxiety disorder, unspecified: F41.9

## 2022-09-15 LAB — TYPE AND SCREEN
ABO/RH(D): O POS
Antibody Screen: NEGATIVE

## 2022-09-15 LAB — POCT PREGNANCY, URINE: Preg Test, Ur: NEGATIVE

## 2022-09-15 SURGERY — HYSTERECTOMY, VAGINAL
Anesthesia: General | Site: Pelvis

## 2022-09-15 MED ORDER — ONDANSETRON HCL 4 MG/2ML IJ SOLN: 4.0000 mg | Freq: Four times a day (QID) | INTRAMUSCULAR | Status: DC | PRN

## 2022-09-15 MED ORDER — SCOPOLAMINE 1 MG/3DAYS TD PT72
1.0000 | MEDICATED_PATCH | TRANSDERMAL | Status: DC
  Administered 2022-09-15: 1.5 mg via TRANSDERMAL

## 2022-09-15 MED ORDER — BUPIVACAINE HCL (PF) 0.5 % IJ SOLN
INTRAMUSCULAR | Status: DC | PRN
  Administered 2022-09-15: 7.4 mL

## 2022-09-15 MED ORDER — DEXAMETHASONE SODIUM PHOSPHATE 10 MG/ML IJ SOLN
INTRAMUSCULAR | Status: DC | PRN
  Administered 2022-09-15: 10 mg via INTRAVENOUS

## 2022-09-15 MED ORDER — DEXTROSE-NACL 5-0.45 % IV SOLN: INTRAVENOUS | Status: DC

## 2022-09-15 MED ORDER — IBUPROFEN 200 MG PO TABS
ORAL_TABLET | ORAL | Status: AC
  Filled 2022-09-15: qty 3

## 2022-09-15 MED ORDER — SIMETHICONE 80 MG PO CHEW
CHEWABLE_TABLET | ORAL | Status: AC
  Filled 2022-09-15: qty 1

## 2022-09-15 MED ORDER — ONDANSETRON HCL 4 MG/2ML IJ SOLN
INTRAMUSCULAR | Status: DC | PRN
  Administered 2022-09-15: 4 mg via INTRAVENOUS

## 2022-09-15 MED ORDER — SCOPOLAMINE 1 MG/3DAYS TD PT72
MEDICATED_PATCH | TRANSDERMAL | Status: AC
  Filled 2022-09-15: qty 1

## 2022-09-15 MED ORDER — AMISULPRIDE (ANTIEMETIC) 5 MG/2ML IV SOLN: 10.0000 mg | Freq: Once | INTRAVENOUS | Status: DC | PRN

## 2022-09-15 MED ORDER — ACETAMINOPHEN 500 MG PO TABS
ORAL_TABLET | ORAL | Status: AC
  Filled 2022-09-15: qty 2

## 2022-09-15 MED ORDER — DEXAMETHASONE SODIUM PHOSPHATE 10 MG/ML IJ SOLN
INTRAMUSCULAR | Status: AC
  Filled 2022-09-15: qty 1

## 2022-09-15 MED ORDER — GABAPENTIN 300 MG PO CAPS
ORAL_CAPSULE | ORAL | Status: AC
  Filled 2022-09-15: qty 1

## 2022-09-15 MED ORDER — FENTANYL CITRATE (PF) 100 MCG/2ML IJ SOLN: 25.0000 ug | INTRAMUSCULAR | Status: DC | PRN

## 2022-09-15 MED ORDER — ROCURONIUM BROMIDE 10 MG/ML (PF) SYRINGE
PREFILLED_SYRINGE | INTRAVENOUS | Status: AC
  Filled 2022-09-15: qty 10

## 2022-09-15 MED ORDER — KETOROLAC TROMETHAMINE 30 MG/ML IJ SOLN
INTRAMUSCULAR | Status: AC
  Filled 2022-09-15: qty 1

## 2022-09-15 MED ORDER — MENTHOL 3 MG MT LOZG: 1.0000 | LOZENGE | OROMUCOSAL | Status: DC | PRN

## 2022-09-15 MED ORDER — PROPOFOL 10 MG/ML IV BOLUS
INTRAVENOUS | Status: DC | PRN
  Administered 2022-09-15: 200 mg via INTRAVENOUS

## 2022-09-15 MED ORDER — PHENYLEPHRINE 80 MCG/ML (10ML) SYRINGE FOR IV PUSH (FOR BLOOD PRESSURE SUPPORT)
PREFILLED_SYRINGE | INTRAVENOUS | Status: DC | PRN
  Administered 2022-09-15: 80 ug via INTRAVENOUS

## 2022-09-15 MED ORDER — LACTATED RINGERS IV SOLN: INTRAVENOUS | Status: DC

## 2022-09-15 MED ORDER — ACETAMINOPHEN 500 MG PO TABS
1000.0000 mg | ORAL_TABLET | ORAL | Status: AC
  Administered 2022-09-15: 1000 mg via ORAL

## 2022-09-15 MED ORDER — OXYCODONE HCL 5 MG PO TABS
ORAL_TABLET | ORAL | Status: AC
  Filled 2022-09-15: qty 1

## 2022-09-15 MED ORDER — FENTANYL CITRATE (PF) 100 MCG/2ML IJ SOLN
INTRAMUSCULAR | Status: DC | PRN
  Administered 2022-09-15 (×3): 50 ug via INTRAVENOUS

## 2022-09-15 MED ORDER — ONDANSETRON HCL 4 MG/2ML IJ SOLN
INTRAMUSCULAR | Status: AC
  Filled 2022-09-15: qty 2

## 2022-09-15 MED ORDER — CEFAZOLIN SODIUM-DEXTROSE 2-4 GM/100ML-% IV SOLN
2.0000 g | INTRAVENOUS | Status: AC
  Administered 2022-09-15: 2 g via INTRAVENOUS

## 2022-09-15 MED ORDER — PHENYLEPHRINE 80 MCG/ML (10ML) SYRINGE FOR IV PUSH (FOR BLOOD PRESSURE SUPPORT)
PREFILLED_SYRINGE | INTRAVENOUS | Status: AC
  Filled 2022-09-15: qty 10

## 2022-09-15 MED ORDER — MIDAZOLAM HCL 2 MG/2ML IJ SOLN
INTRAMUSCULAR | Status: AC
  Filled 2022-09-15: qty 2

## 2022-09-15 MED ORDER — GABAPENTIN 300 MG PO CAPS
300.0000 mg | ORAL_CAPSULE | ORAL | Status: AC
  Administered 2022-09-15: 300 mg via ORAL

## 2022-09-15 MED ORDER — ONDANSETRON HCL 4 MG PO TABS: 4.0000 mg | ORAL_TABLET | Freq: Four times a day (QID) | ORAL | Status: DC | PRN

## 2022-09-15 MED ORDER — KETOROLAC TROMETHAMINE 30 MG/ML IJ SOLN: INTRAMUSCULAR | Status: DC | PRN

## 2022-09-15 MED ORDER — ROCURONIUM BROMIDE 10 MG/ML (PF) SYRINGE
PREFILLED_SYRINGE | INTRAVENOUS | Status: DC | PRN
  Administered 2022-09-15: 60 mg via INTRAVENOUS

## 2022-09-15 MED ORDER — GABAPENTIN 300 MG PO CAPS
300.0000 mg | ORAL_CAPSULE | Freq: Three times a day (TID) | ORAL | Status: DC
  Administered 2022-09-15 – 2022-09-16 (×3): 300 mg via ORAL

## 2022-09-15 MED ORDER — HYDROMORPHONE HCL 1 MG/ML IJ SOLN: 1.0000 mg | INTRAMUSCULAR | Status: DC | PRN

## 2022-09-15 MED ORDER — BISACODYL 10 MG RE SUPP: 10.0000 mg | Freq: Every day | RECTAL | Status: DC | PRN

## 2022-09-15 MED ORDER — LIDOCAINE 2% (20 MG/ML) 5 ML SYRINGE
INTRAMUSCULAR | Status: DC | PRN
  Administered 2022-09-15: 60 mg via INTRAVENOUS

## 2022-09-15 MED ORDER — OXYCODONE HCL 5 MG/5ML PO SOLN: 5.0000 mg | Freq: Once | ORAL | Status: DC | PRN

## 2022-09-15 MED ORDER — LIDOCAINE HCL (PF) 2 % IJ SOLN
INTRAMUSCULAR | Status: AC
  Filled 2022-09-15: qty 5

## 2022-09-15 MED ORDER — ALUM & MAG HYDROXIDE-SIMETH 200-200-20 MG/5ML PO SUSP: 30.0000 mL | ORAL | Status: DC | PRN

## 2022-09-15 MED ORDER — 0.9 % SODIUM CHLORIDE (POUR BTL) OPTIME
TOPICAL | Status: DC | PRN
  Administered 2022-09-15: 1000 mL

## 2022-09-15 MED ORDER — PROPOFOL 10 MG/ML IV BOLUS
INTRAVENOUS | Status: AC
  Filled 2022-09-15: qty 20

## 2022-09-15 MED ORDER — SUGAMMADEX SODIUM 200 MG/2ML IV SOLN
INTRAVENOUS | Status: DC | PRN
  Administered 2022-09-15 (×2): 100 mg via INTRAVENOUS

## 2022-09-15 MED ORDER — SIMETHICONE 80 MG PO CHEW: 80.0000 mg | CHEWABLE_TABLET | Freq: Four times a day (QID) | ORAL | Status: DC | PRN

## 2022-09-15 MED ORDER — FENTANYL CITRATE (PF) 250 MCG/5ML IJ SOLN
INTRAMUSCULAR | Status: AC
  Filled 2022-09-15: qty 5

## 2022-09-15 MED ORDER — CEFAZOLIN SODIUM-DEXTROSE 2-4 GM/100ML-% IV SOLN
INTRAVENOUS | Status: AC
  Filled 2022-09-15: qty 100

## 2022-09-15 MED ORDER — IBUPROFEN 200 MG PO TABS: 600.0000 mg | ORAL_TABLET | Freq: Four times a day (QID) | ORAL | Status: DC

## 2022-09-15 MED ORDER — KETOROLAC TROMETHAMINE 30 MG/ML IJ SOLN
INTRAMUSCULAR | Status: DC | PRN
  Administered 2022-09-15: 30 mg via INTRAVENOUS

## 2022-09-15 MED ORDER — MIDAZOLAM HCL 2 MG/2ML IJ SOLN
INTRAMUSCULAR | Status: DC | PRN
  Administered 2022-09-15: 2 mg via INTRAVENOUS

## 2022-09-15 MED ORDER — OXYCODONE HCL 5 MG PO TABS
5.0000 mg | ORAL_TABLET | ORAL | Status: DC | PRN
  Administered 2022-09-15 – 2022-09-16 (×4): 5 mg via ORAL

## 2022-09-15 MED ORDER — ACETAMINOPHEN 500 MG PO TABS
1000.0000 mg | ORAL_TABLET | Freq: Four times a day (QID) | ORAL | Status: DC
  Administered 2022-09-15 – 2022-09-16 (×4): 1000 mg via ORAL

## 2022-09-15 MED ORDER — VASOPRESSIN 20 UNIT/ML IV SOLN
INTRAVENOUS | Status: DC | PRN
  Administered 2022-09-15: 12.6 mL via INTRAMUSCULAR

## 2022-09-15 MED ORDER — CLONAZEPAM 0.5 MG PO TABS: 0.2500 mg | ORAL_TABLET | Freq: Three times a day (TID) | ORAL | Status: DC | PRN

## 2022-09-15 MED ORDER — SODIUM CHLORIDE 0.9 % IR SOLN
Status: DC | PRN
  Administered 2022-09-15: 200 mL via INTRAVESICAL

## 2022-09-15 MED ORDER — KETOROLAC TROMETHAMINE 30 MG/ML IJ SOLN
30.0000 mg | Freq: Four times a day (QID) | INTRAMUSCULAR | Status: DC
  Administered 2022-09-15 – 2022-09-16 (×3): 30 mg via INTRAVENOUS

## 2022-09-15 MED ORDER — OXYCODONE HCL 5 MG PO TABS: 5.0000 mg | ORAL_TABLET | Freq: Once | ORAL | Status: DC | PRN

## 2022-09-15 SURGICAL SUPPLY — 26 items
CATH ROBINSON RED A/P 16FR (CATHETERS) ×2 IMPLANT
CNTNR URN SCR LID CUP LEK RST (MISCELLANEOUS) IMPLANT
CONT SPEC 4OZ STRL OR WHT (MISCELLANEOUS) ×2
DRAPE STERI URO 9X17 APER PCH (DRAPES) IMPLANT
GAUZE 4X4 16PLY ~~LOC~~+RFID DBL (SPONGE) ×4 IMPLANT
GLOVE BIOGEL PI IND STRL 8 (GLOVE) ×2 IMPLANT
GLOVE ORTHO TXT STRL SZ7.5 (GLOVE) ×2 IMPLANT
GOWN STRL REUS W/TWL XL LVL3 (GOWN DISPOSABLE) ×2 IMPLANT
KIT TURNOVER CYSTO (KITS) ×2 IMPLANT
NDL FILTER BLUNT 18X1 1/2 (NEEDLE) IMPLANT
NEEDLE FILTER BLUNT 18X1 1/2 (NEEDLE) ×2 IMPLANT
NS IRRIG 500ML POUR BTL (IV SOLUTION) ×2 IMPLANT
PACK VAGINAL WOMENS (CUSTOM PROCEDURE TRAY) ×2 IMPLANT
PAD OB MATERNITY 4.3X12.25 (PERSONAL CARE ITEMS) ×2 IMPLANT
SET IRRIG Y TYPE TUR BLADDER L (SET/KITS/TRAYS/PACK) IMPLANT
SHEARS FOC LG CVD HARMONIC 17C (MISCELLANEOUS) ×2 IMPLANT
SUT CHROMIC 1 TIES 18 (SUTURE) IMPLANT
SUT CHROMIC 1MO 4 18 CR8 (SUTURE) ×2 IMPLANT
SUT SILK 2 0 SH (SUTURE) ×2 IMPLANT
SUT VIC AB 2-0 CT1 (SUTURE) IMPLANT
SUT VIC AB 2-0 CT1 27 (SUTURE) ×2
SUT VIC AB 2-0 CT1 TAPERPNT 27 (SUTURE) ×2 IMPLANT
SUT VIC AB 3-0 SH 27 (SUTURE)
SUT VIC AB 3-0 SH 27X BRD (SUTURE) IMPLANT
SYR 5ML LL (SYRINGE) IMPLANT
TOWEL OR 17X26 10 PK STRL BLUE (TOWEL DISPOSABLE) ×2 IMPLANT

## 2022-09-15 NOTE — Anesthesia Postprocedure Evaluation (Signed)
Anesthesia Post Note  Patient: Jaimie Pippins Lose  Procedure(s) Performed: HYSTERECTOMY VAGINAL, BILATERAL SALPINGECTOMY (Bilateral: Pelvis) CYSTOSCOPY (Bladder)     Patient location during evaluation: PACU Anesthesia Type: General Level of consciousness: awake and alert Pain management: pain level controlled Vital Signs Assessment: post-procedure vital signs reviewed and stable Respiratory status: spontaneous breathing, nonlabored ventilation, respiratory function stable and patient connected to nasal cannula oxygen Cardiovascular status: blood pressure returned to baseline and stable Postop Assessment: no apparent nausea or vomiting Anesthetic complications: no   No notable events documented.  Last Vitals:  Vitals:   09/15/22 1036 09/15/22 1135  BP: 105/69 104/61  Pulse: 76 84  Resp: 14 16  Temp: 36.4 C 36.8 C  SpO2: 96% 97%    Last Pain:  Vitals:   09/15/22 1135  TempSrc:   PainSc: Asleep                 Tiajuana Amass

## 2022-09-15 NOTE — Anesthesia Procedure Notes (Signed)
Procedure Name: Intubation Date/Time: 09/15/2022 7:49 AM  Performed by: Mechele Claude, CRNAPre-anesthesia Checklist: Patient identified, Emergency Drugs available, Suction available and Patient being monitored Patient Re-evaluated:Patient Re-evaluated prior to induction Oxygen Delivery Method: Circle system utilized Preoxygenation: Pre-oxygenation with 100% oxygen Induction Type: IV induction Ventilation: Mask ventilation without difficulty Laryngoscope Size: Mac and 3 Grade View: Grade I Tube type: Oral Tube size: 7.0 mm Number of attempts: 1 Airway Equipment and Method: Stylet and Oral airway Placement Confirmation: ETT inserted through vocal cords under direct vision, positive ETCO2 and breath sounds checked- equal and bilateral Secured at: 22 cm Tube secured with: Tape Dental Injury: Teeth and Oropharynx as per pre-operative assessment

## 2022-09-15 NOTE — Anesthesia Preprocedure Evaluation (Signed)
Anesthesia Evaluation  Patient identified by MRN, date of birth, ID band Patient awake    Reviewed: Allergy & Precautions, NPO status , Patient's Chart, lab work & pertinent test results  Airway Mallampati: II  TM Distance: >3 FB Neck ROM: Full    Dental  (+) Dental Advisory Given   Pulmonary neg pulmonary ROS,    breath sounds clear to auscultation       Cardiovascular negative cardio ROS   Rhythm:Regular Rate:Normal     Neuro/Psych negative neurological ROS     GI/Hepatic Neg liver ROS, GERD  ,  Endo/Other  Morbid obesity  Renal/GU negative Renal ROS     Musculoskeletal   Abdominal   Peds  Hematology negative hematology ROS (+)   Anesthesia Other Findings   Reproductive/Obstetrics                             Lab Results  Component Value Date   WBC 7.5 09/11/2022   HGB 12.4 09/11/2022   HCT 38.2 09/11/2022   MCV 93.9 09/11/2022   PLT 257 09/11/2022   Lab Results  Component Value Date   CREATININE 0.69 09/11/2022   BUN 8 09/11/2022   NA 142 09/11/2022   K 4.1 09/11/2022   CL 110 09/11/2022   CO2 25 09/11/2022    Anesthesia Physical Anesthesia Plan  ASA: 3  Anesthesia Plan: General   Post-op Pain Management: Tylenol PO (pre-op)*, Toradol IV (intra-op)* and Gabapentin PO (pre-op)*   Induction: Intravenous  PONV Risk Score and Plan: 4 or greater and Scopolamine patch - Pre-op, Midazolam, Dexamethasone, Ondansetron and Treatment may vary due to age or medical condition  Airway Management Planned: Oral ETT  Additional Equipment: None  Intra-op Plan:   Post-operative Plan: Extubation in OR  Informed Consent: I have reviewed the patients History and Physical, chart, labs and discussed the procedure including the risks, benefits and alternatives for the proposed anesthesia with the patient or authorized representative who has indicated his/her understanding and  acceptance.     Dental advisory given  Plan Discussed with: CRNA  Anesthesia Plan Comments:         Anesthesia Quick Evaluation

## 2022-09-15 NOTE — Op Note (Signed)
Preoperative diagnosis: Pelvic pain-suspected adenomyosis Postop diagnosis: Same Procedure: Total vaginal hysterectomy, bilateral salpingectomy and cystoscopy Surgeon: Cheri Fowler M.D. Assistant: Crawford Givens, D.O. Anesthesia: Gen. Findings: Patient had a normal uterus with normal tubes and ovaries. Via cystoscopy bladder was normal and both ureters were patent.  Assistance from Dr. Terri Piedra was necessary throughout the case to help expose pedicles and control bleeding Estimated blood loss: 50 cc Specimens: Uterus and tubes for routine pathology Complications: None   Procedure in detail: The patient was taken to the operating room and placed in the dorsosupine position. General anesthesia was induced and she was placed in mobile stirrups. Legs were elevated in the stirrups. Perineum and vagina were then prepped and draped in the usual sterile fashion. A weighted speculum was inserted in the vagina and the cervix was grasped with Ardis Hughs tenaculum. Dilute Pitressin with Marcaine was then instilled at the cervicovaginal junction which was then incised circumferentially with electrocautery. Sharp dissection was then used to further free the vagina from the cervix. Anterior peritoneum was identified and entered sharply. A Deaver retractor was used to retract the bladder anteriorly. Posterior cul-de-sac was identified and entered sharply. A Bonnano speculum was placed into the posterior cul-de-sac. Uterosacral ligaments were clamped transected and ligated with #1 chromic and tagged for later use. The remaining pedicles were then taken down with harmonic scalpel.  When just the tubes were still attached, the uterus was delivered posteriorly and both tubes were identified and traced to their fimbria.  Harmonic scalpel was then used to free the tubes and any remaining attachments of the the uterus, the uterus and tubes were removed. Ovaries were inspected and found to be normal. A small amount of bleeding was  controlled with the Harmonic scalpel.  The uterosacral ligaments were then plicated in the midline with 2-0 silk after the Bonnano speculum was removed and a shorter vaginal speculum was placed. The previously tagged uterosacral pedicles were also tied in the midline. No significant bleeding was identified. The vagina was then closed in a vertical fashion with running locking 2-0 Vicryl with adequate closure and adequate hemostasis.   Attention was turned to cystoscopy.  A 70 cystoscope was inserted and 200 cc of fluid was instilled. The bladder appeared normal. Both ureteral orifices were easily identified and urine was seen to flow freely from each orifice. The cystoscope was removed and the bladder was drained with a red robinson catheter. The patient was taken down from stirrups. She was awakened in the operating room and taken to the recovery room in stable condition after tolerating the procedure well. Counts were correct, she had PAS hose on throughout the procedure, she received Ancef preop.

## 2022-09-15 NOTE — Interval H&P Note (Signed)
History and Physical Interval Note:  09/15/2022 7:14 AM  Cindy [name]  has presented today for surgery, with the diagnosis of pelvic pain, probable adenomyosis.  The various methods of treatment have been discussed with the patient and family. After consideration of risks, benefits and other options for treatment, the patient has consented to  Procedure(s): HYSTERECTOMY VAGINAL, POSSIBLE BILATERAL SALPINGECTOMY (Bilateral) CYSTOSCOPY (N/A) as a surgical intervention.  The patient's history has been reviewed, patient examined, no change in status, stable for surgery.  I have reviewed the patient's chart and labs.  Questions were answered to the patient's satisfaction.     Blane Ohara Mekiyah Gladwell

## 2022-09-15 NOTE — Transfer of Care (Signed)
Immediate Anesthesia Transfer of Care Note  Patient: Cindy [name]  Procedure(s) Performed: HYSTERECTOMY VAGINAL, POSSIBLE BILATERAL SALPINGECTOMY (Bilateral) CYSTOSCOPY  Patient Location: PACU  Anesthesia Type:General  Level of Consciousness: drowsy, patient cooperative and responds to stimulation  Airway & Oxygen Therapy: Patient Spontanous Breathing and Patient connected to face mask oxygen  Post-op Assessment: Report given to RN and Post -op Vital signs reviewed and stable  Post vital signs: Reviewed and stable  Last Vitals:  Vitals Value Taken Time  BP 117/76 09/15/22 0855  Temp 36.6 C 09/15/22 0855  Pulse 84 09/15/22 0855  Resp 0 09/15/22 0856  SpO2 100 % 09/15/22 0855  Vitals shown include unvalidated device data.  Last Pain:  Vitals:   09/15/22 0557  TempSrc: Oral  PainSc: 7       Patients Stated Pain Goal: 8 (97/02/63 7858)  Complications: No notable events documented.

## 2022-09-15 NOTE — Progress Notes (Signed)
Post -op check, s/p TVH  Doing ok, pain getting a little worse.  No n/v, tol PO, ambulating, voiding Afeb, VSS Abd- soft  Doing well so far, will continue current care overnight and plan for d/c home in am

## 2022-09-16 ENCOUNTER — Encounter (HOSPITAL_BASED_OUTPATIENT_CLINIC_OR_DEPARTMENT_OTHER): Payer: Self-pay | Admitting: Obstetrics and Gynecology

## 2022-09-16 DIAGNOSIS — N8003 Adenomyosis of the uterus: Secondary | ICD-10-CM | POA: Diagnosis not present

## 2022-09-16 LAB — SURGICAL PATHOLOGY

## 2022-09-16 MED ORDER — OXYCODONE HCL 5 MG PO TABS
ORAL_TABLET | ORAL | Status: AC
  Filled 2022-09-16: qty 1

## 2022-09-16 MED ORDER — IBUPROFEN 600 MG PO TABS: 600.0000 mg | ORAL_TABLET | Freq: Four times a day (QID) | ORAL | 0 refills | Status: AC

## 2022-09-16 MED ORDER — GABAPENTIN 300 MG PO CAPS
ORAL_CAPSULE | ORAL | Status: AC
  Filled 2022-09-16: qty 1

## 2022-09-16 MED ORDER — OXYCODONE HCL 5 MG PO TABS: 5.0000 mg | ORAL_TABLET | ORAL | 0 refills | Status: AC | PRN

## 2022-09-16 MED ORDER — ACETAMINOPHEN 500 MG PO TABS
ORAL_TABLET | ORAL | Status: AC
  Filled 2022-09-16: qty 2

## 2022-09-16 MED ORDER — KETOROLAC TROMETHAMINE 30 MG/ML IJ SOLN
INTRAMUSCULAR | Status: AC
  Filled 2022-09-16: qty 1

## 2022-09-16 MED ORDER — GABAPENTIN 300 MG PO CAPS: 300.0000 mg | ORAL_CAPSULE | Freq: Three times a day (TID) | ORAL | 0 refills | Status: AC

## 2022-09-16 NOTE — Discharge Instructions (Signed)
Routine instructions for vaginal hysterectomy 

## 2022-09-16 NOTE — Discharge Summary (Signed)
Physician Discharge Summary  Patient ID: Cindy [name] MRN: [id number] DOB/AGE: 43/27/1982 43 y.o.  Admit date: 09/15/2022 Discharge date: 09/16/2022  Admission Diagnoses:  Pelvic pain  Discharge Diagnoses: Same, suspect adenomyosis Principal Problem:   Chronic pelvic pain in female Active Problems:   S/P vaginal hysterectomy   Discharged Condition: good  Hospital Course: Pt admitted and underwent TVH, bilateral salpingectomy and cystoscopy under general anesthesia without difficulty.  EBL 50 cc.  No post-op complications.  She met milestones but was not comfortable enough for discharge on night of surgery.  No problems overnight, stable for discharge POD #1  Discharge Exam: Blood pressure (!) 93/48, pulse 64, temperature 97.8 F (36.6 C), temperature source Oral, resp. rate 18, height $RemoveBe'5\' 9"'NguJbPPxi$  (1.753 m), weight 123.4 kg, last menstrual period 08/04/2022, SpO2 98 %. General appearance: alert  Disposition: Discharge disposition: 01-Home or Self Care       Discharge Instructions     Call MD for:  extreme fatigue   Complete by: As directed    Call MD for:  persistant dizziness or light-headedness   Complete by: As directed    Call MD for:  persistant nausea and vomiting   Complete by: As directed    Call MD for:  severe uncontrolled pain   Complete by: As directed    Call MD for:  temperature >100.4   Complete by: As directed    Diet - low sodium heart healthy   Complete by: As directed    Increase activity slowly   Complete by: As directed    Lifting restrictions   Complete by: As directed    10 lbs   Sexual Activity Restrictions   Complete by: As directed    Pelvic rest      Allergies as of 09/16/2022       Reactions   Mushroom Extract Complex Other (See Comments)   Itching of tongue and eyes   Shrimp Flavor Itching        Medication List     STOP taking these medications    VITAMIN D3 PO       TAKE these medications    amoxicillin 500  MG capsule Commonly known as: AMOXIL Take 500 mg by mouth 2 (two) times daily.   buPROPion 150 MG 12 hr tablet Commonly known as: WELLBUTRIN SR Take 150 mg by mouth daily.   clonazePAM 0.5 MG tablet Commonly known as: KLONOPIN Take 0.25 mg by mouth 3 (three) times daily as needed for anxiety. 0.25 mg tid prn   gabapentin 300 MG capsule Commonly known as: NEURONTIN Take 1 capsule (300 mg total) by mouth 3 (three) times daily for 2 days.   GINGER ROOT PO Take by mouth.   ibuprofen 600 MG tablet Commonly known as: ADVIL Take 1 tablet (600 mg total) by mouth every 6 (six) hours.   Magnesium Citrate 200 MG Tabs Take 250 mg by mouth as needed.   NON FORMULARY Sea moss extract prn   oxyCODONE 5 MG immediate release tablet Commonly known as: Oxy IR/ROXICODONE Take 1 tablet (5 mg total) by mouth every 4 (four) hours as needed for severe pain.   sertraline 50 MG tablet Commonly known as: ZOLOFT Take 50 mg by mouth daily.   vitamin B-12 100 MCG tablet Commonly known as: CYANOCOBALAMIN Take 100 mcg by mouth daily. [employer], pt unsure of mg.        Follow-up Information     Racheal Mathurin, Sherren Mocha, MD. Schedule an appointment as soon  as possible for a visit in 6 week(s).   Specialty: Obstetrics and Gynecology Contact information: [address] [place] [address] [phone]                 Signed: Blane Ohara Johnnie Moten 09/16/2022, 7:26 AM

## 2022-09-16 NOTE — Progress Notes (Signed)
1 Day Post-Op Procedure(s) (LRB): HYSTERECTOMY VAGINAL, BILATERAL SALPINGECTOMY (Bilateral) CYSTOSCOPY (N/A)  Subjective: Patient reports incisional pain, tolerating PO, + flatus, and no problems voiding.    Objective: I have reviewed patient's vital signs, intake and output, and medications.  General: alert GI: soft, minimal tenderness  Assessment: s/p Procedure(s): HYSTERECTOMY VAGINAL, BILATERAL SALPINGECTOMY (Bilateral) CYSTOSCOPY (N/A): stable and progressing well  Plan: Discharge home  LOS: 0 days    Clarene Duke, MD 09/16/2022, 7:22 AM

## 2022-09-22 ENCOUNTER — Emergency Department (HOSPITAL_COMMUNITY)
Admission: EM | Admit: 2022-09-22 | Discharge: 2022-09-22 | Discharge status: Against Medical Advice | Payer: Medicaid Other | Attending: Student | Admitting: Student

## 2022-09-22 ENCOUNTER — Encounter (HOSPITAL_COMMUNITY): Payer: Self-pay

## 2022-09-22 DIAGNOSIS — I959 Hypotension, unspecified: Secondary | ICD-10-CM | POA: Diagnosis not present

## 2022-09-22 DIAGNOSIS — R519 Headache, unspecified: Secondary | ICD-10-CM | POA: Diagnosis not present

## 2022-09-22 DIAGNOSIS — R42 Dizziness and giddiness: Secondary | ICD-10-CM | POA: Diagnosis present

## 2022-09-22 DIAGNOSIS — R109 Unspecified abdominal pain: Secondary | ICD-10-CM | POA: Insufficient documentation

## 2022-09-22 DIAGNOSIS — Z5321 Procedure and treatment not carried out due to patient leaving prior to being seen by health care provider: Secondary | ICD-10-CM | POA: Diagnosis not present

## 2022-09-22 LAB — CBC WITH DIFFERENTIAL/PLATELET
Abs Immature Granulocytes: 0.11 10*3/uL — ABNORMAL HIGH (ref 0.00–0.07)
Basophils Absolute: 0 10*3/uL (ref 0.0–0.1)
Basophils Relative: 0 %
Eosinophils Absolute: 0.5 10*3/uL (ref 0.0–0.5)
Eosinophils Relative: 6 %
HCT: 38.7 % (ref 36.0–46.0)
Hemoglobin: 12.5 g/dL (ref 12.0–15.0)
Immature Granulocytes: 1 %
Lymphocytes Relative: 27 %
Lymphs Abs: 2.4 10*3/uL (ref 0.7–4.0)
MCH: 30 pg (ref 26.0–34.0)
MCHC: 32.3 g/dL (ref 30.0–36.0)
MCV: 92.8 fL (ref 80.0–100.0)
Monocytes Absolute: 0.6 10*3/uL (ref 0.1–1.0)
Monocytes Relative: 7 %
Neutro Abs: 5.3 10*3/uL (ref 1.7–7.7)
Neutrophils Relative %: 59 %
Platelets: 270 10*3/uL (ref 150–400)
RBC: 4.17 MIL/uL (ref 3.87–5.11)
RDW: 12.4 % (ref 11.5–15.5)
WBC: 9 10*3/uL (ref 4.0–10.5)
nRBC: 0 % (ref 0.0–0.2)

## 2022-09-22 LAB — COMPREHENSIVE METABOLIC PANEL
ALT: 39 U/L (ref 0–44)
AST: 33 U/L (ref 15–41)
Albumin: 2.9 g/dL — ABNORMAL LOW (ref 3.5–5.0)
Alkaline Phosphatase: 73 U/L (ref 38–126)
Anion gap: 6 (ref 5–15)
BUN: 6 mg/dL (ref 6–20)
CO2: 25 mmol/L (ref 22–32)
Calcium: 8.2 mg/dL — ABNORMAL LOW (ref 8.9–10.3)
Chloride: 109 mmol/L (ref 98–111)
Creatinine, Ser: 0.48 mg/dL (ref 0.44–1.00)
GFR, Estimated: >60 mL/min (ref >60)
Glucose, Bld: 91 mg/dL (ref 70–99)
Potassium: 3.8 mmol/L (ref 3.5–5.1)
Sodium: 140 mmol/L (ref 135–145)
Total Bilirubin: 0.4 mg/dL (ref 0.3–1.2)
Total Protein: 6.3 g/dL — ABNORMAL LOW (ref 6.5–8.1)

## 2022-09-22 LAB — TSH: TSH: 2.392 u[IU]/mL (ref 0.350–4.500)

## 2022-09-22 LAB — T4, FREE: Free T4: 0.96 ng/dL (ref 0.61–1.12)

## 2022-09-22 LAB — MAGNESIUM: Magnesium: 1.8 mg/dL (ref 1.7–2.4)

## 2022-09-22 NOTE — ED Notes (Signed)
I call patient for vitals and no on responded

## 2022-09-22 NOTE — ED Notes (Signed)
I called patient for vitals and no one responded

## 2022-09-22 NOTE — ED Provider Triage Note (Signed)
Emergency Medicine Provider Triage Evaluation Note  Cindy [name] , a 41 y.o. female  was evaluated in triage.  Pt complains of dizziness and hypotension x2 weeks.  Started before her hysterectomy, had hysterectomy a week ago.  The dizziness feels like the room is spinning, she also feels sometimes like she needs to pass out.  Associated with a bandlike headache across her head which comes and goes, no vision changes.  She does endorse some abdominal pain but no vaginal bleeding after the surgery.  No recent changes in medicine, no chest pain..  Review of Systems  Per HPI  Physical Exam  BP 116/60   Pulse 77   Temp 98.5 F (36.9 C)   Resp 18   LMP 08/04/2022   SpO2 100%  Gen:   Awake, no distress   Resp:  Normal effort  MSK:   Moves extremities without difficulty  Other:  S1-S2, upper and lower extremity pulses 2+ symmetric.  Abdomen is not focally tender.  Medical Decision Making  Medically screening exam initiated at 12:16 PM.  Appropriate orders placed.  Edan Serratore Rog was informed that the remainder of the evaluation will be completed by another provider, this initial triage assessment does not replace that evaluation, and the importance of remaining in the ED until their evaluation is complete.     Sherrill Raring, PA-C 09/22/22 1217

## 2022-09-22 NOTE — ED Notes (Signed)
Called patient name again and no one responded

## 2022-09-22 NOTE — ED Triage Notes (Signed)
Pt arrived via POV, c/o dizziness and hypotension. Was seen at PCP and told to come to ED due to BP in the 70J systolic. S/p total hysterectomy x1 week ago.
